# Patient Record
Sex: Female | Born: 1995 | Race: Black or African American | Hispanic: No | Marital: Single | Smoking: Never smoker
Health system: Southern US, Community
[De-identification: ages and names within clinical notes are randomized; demographics above are authoritative.]

## PROBLEM LIST (undated history)

## (undated) HISTORY — PX: MANDIBLE SURGERY: SHX707

---

## 2015-12-07 DIAGNOSIS — Z9109 Other allergy status, other than to drugs and biological substances: Secondary | ICD-10-CM | POA: Insufficient documentation

## 2016-07-24 DIAGNOSIS — G43109 Migraine with aura, not intractable, without status migrainosus: Secondary | ICD-10-CM | POA: Insufficient documentation

## 2018-04-16 ENCOUNTER — Encounter (HOSPITAL_COMMUNITY): Payer: Self-pay | Admitting: Emergency Medicine

## 2018-04-16 ENCOUNTER — Emergency Department (HOSPITAL_COMMUNITY): Payer: BLUE CROSS/BLUE SHIELD

## 2018-04-16 ENCOUNTER — Emergency Department (HOSPITAL_COMMUNITY)
Admission: EM | Admit: 2018-04-16 | Discharge: 2018-04-16 | Disposition: A | Discharge status: Home / Self Care | Payer: BLUE CROSS/BLUE SHIELD | Attending: Emergency Medicine | Admitting: Emergency Medicine

## 2018-04-16 DIAGNOSIS — J069 Acute upper respiratory infection, unspecified: Secondary | ICD-10-CM | POA: Diagnosis not present

## 2018-04-16 DIAGNOSIS — R002 Palpitations: Secondary | ICD-10-CM | POA: Insufficient documentation

## 2018-04-16 DIAGNOSIS — Z79899 Other long term (current) drug therapy: Secondary | ICD-10-CM | POA: Diagnosis not present

## 2018-04-16 LAB — BASIC METABOLIC PANEL
Anion gap: 10 (ref 5–15)
BUN: 7 mg/dL (ref 6–20)
CO2: 27 mmol/L (ref 22–32)
Calcium: 9.4 mg/dL (ref 8.9–10.3)
Chloride: 102 mmol/L (ref 98–111)
Creatinine, Ser: 0.86 mg/dL (ref 0.44–1.00)
GFR calc Af Amer: >60 mL/min (ref >60)
GFR calc non Af Amer: >60 mL/min (ref >60)
GLUCOSE: 107 mg/dL — AB (ref 70–99)
Potassium: 3.3 mmol/L — ABNORMAL LOW (ref 3.5–5.1)
Sodium: 139 mmol/L (ref 135–145)

## 2018-04-16 LAB — CBC WITH DIFFERENTIAL/PLATELET
Abs Immature Granulocytes: 0.04 10*3/uL (ref 0.00–0.07)
Basophils Absolute: 0.1 10*3/uL (ref 0.0–0.1)
Basophils Relative: 1 %
Eosinophils Absolute: 0 10*3/uL (ref 0.0–0.5)
Eosinophils Relative: 1 %
HEMATOCRIT: 39.4 % (ref 36.0–46.0)
HEMOGLOBIN: 12.4 g/dL (ref 12.0–15.0)
Immature Granulocytes: 1 %
Lymphocytes Relative: 28 %
Lymphs Abs: 1.5 10*3/uL (ref 0.7–4.0)
MCH: 28.9 pg (ref 26.0–34.0)
MCHC: 31.5 g/dL (ref 30.0–36.0)
MCV: 91.8 fL (ref 80.0–100.0)
MONOS PCT: 10 %
Monocytes Absolute: 0.5 10*3/uL (ref 0.1–1.0)
Neutro Abs: 3.1 10*3/uL (ref 1.7–7.7)
Neutrophils Relative %: 59 %
Platelets: 330 10*3/uL (ref 150–400)
RBC: 4.29 MIL/uL (ref 3.87–5.11)
RDW: 13.3 % (ref 11.5–15.5)
WBC: 5.2 10*3/uL (ref 4.0–10.5)
nRBC: 0 % (ref 0.0–0.2)

## 2018-04-16 LAB — I-STAT TROPONIN, ED: Troponin i, poc: 0 ng/mL (ref 0.00–0.08)

## 2018-04-16 MED ORDER — BENZONATATE 200 MG PO CAPS: 200.0000 mg | ORAL_CAPSULE | Freq: Three times a day (TID) | ORAL | 0 refills | Status: DC | PRN

## 2018-04-16 MED ORDER — GUAIFENESIN ER 600 MG PO TB12: 1200.0000 mg | ORAL_TABLET | Freq: Two times a day (BID) | ORAL | 0 refills | Status: DC

## 2018-04-16 NOTE — ED Triage Notes (Addendum)
Patient reports intermittent palpitations with mild SOB this evening , denies chest pain , no cough or congestion . Patient stated taking OTC medication for sore throat and left ear ache .

## 2018-04-16 NOTE — ED Provider Notes (Signed)
[facility] EMERGENCY DEPARTMENT Provider Note   CSN: [id number] Arrival date & time: 04/16/18  0207     History   Chief Complaint Chief Complaint  Patient presents with  . Palpitations    HPI Katherine Dunlap is a 22 y.o. female.  Patient presents to the emergency department for evaluation of intermittent episodes of palpitations.  Patient reports symptoms began this evening.  She noticed that her heart seemed to be irregular and possibly racing, felt slightly short of breath.  She did not have any chest pain.  Patient reports that she has been sick for the last 4 days.  She saw her primary doctor for this, developed sore throat, left ear pain, congestion.  She has been taking over-the-counter medications.  She reports that she took an over-the-counter cold pill tonight, not very long after she took a liquid cold medicine.  The palpitations started after the second dose of meds.     History reviewed. No pertinent past medical history.  There are no active problems to display for this patient.   Past Surgical History:  Procedure Laterality Date  . MANDIBLE SURGERY       OB History   No obstetric history on file.      Home Medications    Prior to Admission medications   Medication Sig Start Date End Date Taking? Authorizing Provider  dextromethorphan (DELSYM) 30 MG/5ML liquid Take 60 mg by mouth as needed for cough.   Yes [provider]  Melatonin 5 MG TABS Take 5 mg by mouth at bedtime.   Yes [provider]  valACYclovir (VALTREX) 500 MG tablet Take 500 mg by mouth daily. 04/12/18  Yes [provider]  benzonatate (TESSALON) 200 MG capsule Take 1 capsule (200 mg total) by mouth 3 (three) times daily as needed for cough. 04/16/18   Gilda Crease, MD  guaiFENesin (MUCINEX) 600 MG 12 hr tablet Take 2 tablets (1,200 mg total) by mouth 2 (two) times daily. 04/16/18   Gilda Crease, MD    Family History No family  history on file.  Social History Social History   Tobacco Use  . Smoking status: Never Smoker  . Smokeless tobacco: Never Used  Substance Use Topics  . Alcohol use: Never    Frequency: Never  . Drug use: Never     Allergies   Patient has no known allergies.   Review of Systems Review of Systems  Respiratory: Positive for shortness of breath.   Cardiovascular: Positive for palpitations.  All other systems reviewed and are negative.    Physical Exam Updated Vital Signs Ht 5\' 6"  (1.676 m)   Wt 111.6 kg   LMP 04/01/2018   BMI 39.71 kg/m   Physical Exam Vitals signs and nursing note reviewed.  Constitutional:      General: She is not in acute distress.    Appearance: Normal appearance. She is well-developed.  HENT:     Head: Normocephalic and atraumatic.     Right Ear: Hearing normal.     Left Ear: Hearing and tympanic membrane normal.     Nose: Nose normal.     Mouth/Throat:     Pharynx: No oropharyngeal exudate or posterior oropharyngeal erythema.  Eyes:     Conjunctiva/sclera: Conjunctivae normal.     Pupils: Pupils are equal, round, and reactive to light.  Neck:     Musculoskeletal: Normal range of motion and neck supple.  Cardiovascular:     Rate and Rhythm: Regular  rhythm.     Heart sounds: S1 normal and S2 normal. No murmur. No friction rub. No gallop.   Pulmonary:     Effort: Pulmonary effort is normal. No respiratory distress.     Breath sounds: Normal breath sounds.  Chest:     Chest wall: No tenderness.  Abdominal:     General: Bowel sounds are normal.     Palpations: Abdomen is soft.     Tenderness: There is no abdominal tenderness. There is no guarding or rebound. Negative signs include Murphy's sign and McBurney's sign.     Hernia: No hernia is present.  Musculoskeletal: Normal range of motion.  Skin:    General: Skin is warm and dry.     Findings: No rash.  Neurological:     Mental Status: She is alert and oriented to person, place, and  time.     GCS: GCS eye subscore is 4. GCS verbal subscore is 5. GCS motor subscore is 6.     Cranial Nerves: No cranial nerve deficit.     Sensory: No sensory deficit.     Coordination: Coordination normal.  Psychiatric:        Speech: Speech normal.        Behavior: Behavior normal.        Thought Content: Thought content normal.      ED Treatments / Results  Labs (all labs ordered are listed, but only abnormal results are displayed) Labs Reviewed  BASIC METABOLIC PANEL - Abnormal; Notable for the following components:      Result Value   Potassium 3.3 (*)    Glucose, Bld 107 (*)    All other components within normal limits  CBC WITH DIFFERENTIAL/PLATELET  I-STAT TROPONIN, ED    EKG EKG Interpretation  Date/Time:  Friday April 16 2018 02:17:31 EST Ventricular Rate:  67 PR Interval:    QRS Duration: 90 QT Interval:  394 QTC Calculation: 416 R Axis:   30 Text Interpretation:  Sinus arrhythmia Otherwise within normal limits Confirmed by Gilda CreasePollina, Lochlin Eppinger J [phone]([id number]) on 04/16/2018 2:32:09 AM   Radiology Dg Chest 2 View  Result Date: 04/16/2018 CLINICAL DATA:  22 y/o F; palpitations, shortness of breath, and sore throat for 1 week. EXAM: CHEST - 2 VIEW COMPARISON:  None. FINDINGS: The heart size and mediastinal contours are within normal limits. Both lungs are clear. The visualized skeletal structures are unremarkable. IMPRESSION: No acute pulmonary process identified. Electronically Signed   By: Mitzi HansenLance  Furusawa-Stratton M.D.   On: 04/16/2018 02:59    Procedures Procedures (including critical care time)  Medications Ordered in ED Medications - No data to display   Initial Impression / Assessment and Plan / ED Course  I have reviewed the triage vital signs and the nursing notes.  Pertinent labs & imaging results that were available during my care of the patient were reviewed by me and considered in my medical decision making (see chart for details).      Patient presents to the emergency department for evaluation of palpitations.  Patient reports that she has been sick for several days with a cold.  She has been taking multiple over-the-counter cold medications.  She took 2 different medicines in a short period of time tonight and then developed palpitations.  Suspect that the palpitations are secondary to the OTC medication.  She has not had any significant arrhythmia noted on the monitor today.  EKG was unremarkable.  Lab work is unremarkable.  Chest x-ray does not show any  evidence of pneumonia or other infectious etiology.  Examination is otherwise unremarkable.  Patient does not require any further work-up.  Will prescribe URI medication regimen that will not cause symptoms, discontinue OTC cold medications.  Final Clinical Impressions(s) / ED Diagnoses   Final diagnoses:  Palpitations  Viral upper respiratory tract infection    ED Discharge Orders         Ordered    benzonatate (TESSALON) 200 MG capsule  3 times daily PRN     04/16/18 0405    guaiFENesin (MUCINEX) 600 MG 12 hr tablet  2 times daily     04/16/18 0405           Gilda Crease, MD 04/16/18 [phone]

## 2018-04-16 NOTE — ED Notes (Signed)
Patient transported to X-ray 

## 2018-04-17 ENCOUNTER — Emergency Department (HOSPITAL_COMMUNITY)
Admission: EM | Admit: 2018-04-17 | Discharge: 2018-04-17 | Disposition: A | Discharge status: Home / Self Care | Payer: BLUE CROSS/BLUE SHIELD | Attending: Emergency Medicine | Admitting: Emergency Medicine

## 2018-04-17 DIAGNOSIS — N760 Acute vaginitis: Secondary | ICD-10-CM | POA: Insufficient documentation

## 2018-04-17 DIAGNOSIS — B9689 Other specified bacterial agents as the cause of diseases classified elsewhere: Secondary | ICD-10-CM

## 2018-04-17 DIAGNOSIS — Z79899 Other long term (current) drug therapy: Secondary | ICD-10-CM | POA: Diagnosis not present

## 2018-04-17 DIAGNOSIS — N898 Other specified noninflammatory disorders of vagina: Secondary | ICD-10-CM

## 2018-04-17 LAB — URINALYSIS, ROUTINE W REFLEX MICROSCOPIC
Bilirubin Urine: NEGATIVE
Glucose, UA: NEGATIVE mg/dL
Hgb urine dipstick: NEGATIVE
Ketones, ur: NEGATIVE mg/dL
Leukocytes, UA: NEGATIVE
Nitrite: NEGATIVE
Protein, ur: NEGATIVE mg/dL
Specific Gravity, Urine: 1.012 (ref 1.005–1.030)
pH: 6 (ref 5.0–8.0)

## 2018-04-17 LAB — WET PREP, GENITAL
Sperm: NONE SEEN
TRICH WET PREP: NONE SEEN
WBC WET PREP: NONE SEEN
Yeast Wet Prep HPF POC: NONE SEEN

## 2018-04-17 LAB — I-STAT BETA HCG BLOOD, ED (MC, WL, AP ONLY): I-stat hCG, quantitative: <5 m[IU]/mL (ref <5)

## 2018-04-17 MED ORDER — METRONIDAZOLE 500 MG PO TABS: 500.0000 mg | ORAL_TABLET | Freq: Two times a day (BID) | ORAL | 0 refills | Status: DC

## 2018-04-17 NOTE — ED Triage Notes (Signed)
Pt reports vaginal discharge since last Saturday, reports recent herpes tests have been negative, reports what feels like a sore inside her vagina and difficulty urinating. Also reports lip tingling.

## 2018-04-17 NOTE — ED Provider Notes (Signed)
[facility] Provider Note   CSN: [id number] Arrival date & time: 04/17/18  1230     History   Chief Complaint Chief Complaint  Patient presents with  . Vaginal Discharge    HPI Katherine Dunlap is a 22 y.o. female.  Katherine Dunlap is a 22 y.o. female who is otherwise healthy, presents to the emergency department for evaluation of vaginal irritation and discharge since last Saturday.  She was seen by her primary care doctor with concern for a bump in her perineal region, had reassuring exam at that time and negative herpes testing but she reports this morning she started having worsening discharge and pain at the opening of the vagina.  She denies any dysuria or urinary frequency.  Has not had any vaginal bleeding.  No abdominal pain, nausea, vomiting or fevers.  Patient appears fixated on patient repeatedly asking about herpes even though she has had both negative serum and culture testing from PCP twice in the last 2 months, and wondering if it looks like she has any lesions over her lips that could be herpes as well.  She reports her sister has herpes and she is very concerned that she could have this after seeing what her sister goes through with it.     No past medical history on file.  There are no active problems to display for this patient.   Past Surgical History:  Procedure Laterality Date  . MANDIBLE SURGERY       OB History   No obstetric history on file.      Home Medications    Prior to Admission medications   Medication Sig Start Date End Date Taking? Authorizing Provider  benzonatate (TESSALON) 200 MG capsule Take 1 capsule (200 mg total) by mouth 3 (three) times daily as needed for cough. 04/16/18   Gilda CreasePollina, Christopher J, MD  dextromethorphan (DELSYM) 30 MG/5ML liquid Take 60 mg by mouth as needed for cough.    [provider]  guaiFENesin (MUCINEX) 600 MG 12 hr tablet Take 2 tablets (1,200 mg total) by mouth 2 (two)  times daily. 04/16/18   Gilda CreasePollina, Christopher J, MD  Melatonin 5 MG TABS Take 5 mg by mouth at bedtime.    [provider]  metroNIDAZOLE (FLAGYL) 500 MG tablet Take 1 tablet (500 mg total) by mouth 2 (two) times daily. One po bid x 7 days 04/17/18   Dartha LodgeFord, Taleya Whitcher N, PA-C  valACYclovir (VALTREX) 500 MG tablet Take 500 mg by mouth daily. 04/12/18   [provider]    Family History No family history on file.  Social History Social History   Tobacco Use  . Smoking status: Never Smoker  . Smokeless tobacco: Never Used  Substance Use Topics  . Alcohol use: Never    Frequency: Never  . Drug use: Never     Allergies   Patient has no known allergies.   Review of Systems Review of Systems  Constitutional: Negative for chills and fever.  HENT: Negative for mouth sores.   Gastrointestinal: Negative for abdominal pain, nausea and vomiting.  Genitourinary: Positive for vaginal discharge and vaginal pain. Negative for dysuria, flank pain, frequency, genital sores, menstrual problem, pelvic pain and vaginal bleeding.  Skin: Negative for color change and rash.  All other systems reviewed and are negative.    Physical Exam Updated Vital Signs BP (!) 152/97   Pulse 93   Temp 98.3 F (36.8 C) (Oral)   Resp 16  Ht 5\' 6"  (1.676 m)   Wt 111.6 kg   LMP 04/01/2018   SpO2 96%   BMI 39.70 kg/m   Physical Exam Vitals signs and nursing note reviewed. Exam conducted with a chaperone present.  Constitutional:      General: She is not in acute distress.    Appearance: She is well-developed. She is not diaphoretic.  HENT:     Head: Normocephalic and atraumatic.     Mouth/Throat:     Pharynx: Oropharynx is clear.     Comments: No lesions noted over the lips or buccal mucosa Eyes:     General:        Right eye: No discharge.        Left eye: No discharge.  Pulmonary:     Effort: Pulmonary effort is normal. No respiratory distress.  Abdominal:     General: Abdomen is  flat. Bowel sounds are normal. There is no distension.     Palpations: Abdomen is soft. There is no mass.     Tenderness: There is no abdominal tenderness. There is no guarding.     Comments: Abdomen soft, nondistended, nontender to palpation in all quadrants without guarding or peritoneal signs  Genitourinary:    Comments: No external genital lesions noted on exam. Speculum exam reveals mildly erythematous vaginal walls, no evidence of Bartholin cyst.  Moderate amount of white foul-smelling discharge present in the vaginal vault.  Bimanual exam reveals no cervical motion tenderness, no focal adnexal tenderness or masses bilaterally. Neurological:     Mental Status: She is alert.     Coordination: Coordination normal.  Psychiatric:        Behavior: Behavior normal.      ED Treatments / Results  Labs (all labs ordered are listed, but only abnormal results are displayed) Labs Reviewed  WET PREP, GENITAL - Abnormal; Notable for the following components:      Result Value   Clue Cells Wet Prep HPF POC PRESENT (*)    All other components within normal limits  URINALYSIS, ROUTINE W REFLEX MICROSCOPIC  RPR  HIV ANTIBODY (ROUTINE TESTING W REFLEX)  I-STAT BETA HCG BLOOD, ED ([facility], [facility], [facility] ONLY)  GC/CHLAMYDIA PROBE AMP ([place]) NOT AT [facility]    EKG None  Radiology Dg Chest 2 View  Result Date: 04/16/2018 CLINICAL DATA:  22 y/o F; palpitations, shortness of breath, and sore throat for 1 week. EXAM: CHEST - 2 VIEW COMPARISON:  None. FINDINGS: The heart size and mediastinal contours are within normal limits. Both lungs are clear. The visualized skeletal structures are unremarkable. IMPRESSION: No acute pulmonary process identified. Electronically Signed   By: Mitzi Hansen M.D.   On: 04/16/2018 02:59    Procedures Procedures (including critical care time)  Medications Ordered in ED Medications - No data to display   Initial Impression / Assessment and Plan / ED Course    I have reviewed the triage vital signs and the nursing notes.  Pertinent labs & imaging results that were available during my care of the patient were reviewed by me and considered in my medical decision making (see chart for details).  Patient presents with concern for vaginal irritation and discharge.  She had a bump in the perineal region earlier this week and saw her PCP for it and had negative herpes testing but reports she is since developed some irritation at the vaginal opening and vaginal discharge.  No abdominal pain, nausea or vomiting, no fevers.  On exam patient has  moderate amounts of foul-smelling thin white discharge, no genital lesions noted.  No evidence of Bartholin's gland cyst.  Wet prep consistent with BV, no WBCs or yeast.  Bimanual exam not concerning for PID.  STD testing pending and patient is aware that if this returns positive she will need to follow-up with her PCP for treatment and notify any partners, but will treat with Flagyl for BV in the meantime and have her follow-up if discharge and vaginal irritation does not resolve.  Patient expresses understanding and agreement with plan.  Stable for discharge home in good condition.  Final Clinical Impressions(s) / ED Diagnoses   Final diagnoses:  BV (bacterial vaginosis)  Vaginal discharge    ED Discharge Orders         Ordered    metroNIDAZOLE (FLAGYL) 500 MG tablet  2 times daily     04/17/18 1405           Dartha Lodge, [place] 04/17/18 1430    Maia Plan, MD 04/17/18 2025

## 2018-04-17 NOTE — ED Notes (Signed)
Pt stable, ambulatory, states understanding of discharge instructions 

## 2018-04-17 NOTE — Discharge Instructions (Signed)
Please take antibiotics as directed to treat bacterial vaginosis, you have STD testing pending will be contacted in 2 to 3 days if any results are positive.  If your vaginal irritation and discharge does not resolve with treatment of BV please follow-up with your primary care doctor for reevaluation.  Return to the emergency department if you develop abdominal pain, fevers, vomiting or any other new or concerning symptoms.

## 2018-04-18 LAB — HIV ANTIBODY (ROUTINE TESTING W REFLEX): HIV Screen 4th Generation wRfx: NONREACTIVE

## 2018-04-18 LAB — RPR: RPR Ser Ql: NONREACTIVE

## 2018-04-19 LAB — GC/CHLAMYDIA PROBE AMP (~~LOC~~) NOT AT ARMC
Chlamydia: NEGATIVE
NEISSERIA GONORRHEA: NEGATIVE

## 2018-05-11 DIAGNOSIS — F322 Major depressive disorder, single episode, severe without psychotic features: Secondary | ICD-10-CM | POA: Insufficient documentation

## 2019-01-25 ENCOUNTER — Encounter (HOSPITAL_COMMUNITY): Payer: Self-pay | Admitting: Emergency Medicine

## 2019-01-25 ENCOUNTER — Emergency Department (HOSPITAL_COMMUNITY)
Admission: EM | Admit: 2019-01-25 | Discharge: 2019-01-25 | Disposition: A | Discharge status: Home / Self Care | Payer: BC Managed Care – PPO | Attending: Emergency Medicine | Admitting: Emergency Medicine

## 2019-01-25 ENCOUNTER — Other Ambulatory Visit: Payer: Self-pay

## 2019-01-25 ENCOUNTER — Emergency Department (HOSPITAL_COMMUNITY): Payer: BC Managed Care – PPO

## 2019-01-25 DIAGNOSIS — R05 Cough: Secondary | ICD-10-CM | POA: Insufficient documentation

## 2019-01-25 DIAGNOSIS — R072 Precordial pain: Secondary | ICD-10-CM | POA: Insufficient documentation

## 2019-01-25 DIAGNOSIS — Z20828 Contact with and (suspected) exposure to other viral communicable diseases: Secondary | ICD-10-CM | POA: Insufficient documentation

## 2019-01-25 DIAGNOSIS — Z79899 Other long term (current) drug therapy: Secondary | ICD-10-CM | POA: Insufficient documentation

## 2019-01-25 DIAGNOSIS — R0602 Shortness of breath: Secondary | ICD-10-CM | POA: Diagnosis not present

## 2019-01-25 DIAGNOSIS — R059 Cough, unspecified: Secondary | ICD-10-CM

## 2019-01-25 DIAGNOSIS — R079 Chest pain, unspecified: Secondary | ICD-10-CM | POA: Diagnosis present

## 2019-01-25 LAB — BASIC METABOLIC PANEL
Anion gap: 10 (ref 5–15)
BUN: 8 mg/dL (ref 6–20)
CO2: 24 mmol/L (ref 22–32)
Calcium: 9.4 mg/dL (ref 8.9–10.3)
Chloride: 105 mmol/L (ref 98–111)
Creatinine, Ser: 0.75 mg/dL (ref 0.44–1.00)
GFR calc Af Amer: >60 mL/min (ref >60)
GFR calc non Af Amer: >60 mL/min (ref >60)
Glucose, Bld: 99 mg/dL (ref 70–99)
Potassium: 3.5 mmol/L (ref 3.5–5.1)
Sodium: 139 mmol/L (ref 135–145)

## 2019-01-25 LAB — CBC
HCT: 39.3 % (ref 36.0–46.0)
Hemoglobin: 12.5 g/dL (ref 12.0–15.0)
MCH: 29.4 pg (ref 26.0–34.0)
MCHC: 31.8 g/dL (ref 30.0–36.0)
MCV: 92.5 fL (ref 80.0–100.0)
Platelets: 332 10*3/uL (ref 150–400)
RBC: 4.25 MIL/uL (ref 3.87–5.11)
RDW: 13.4 % (ref 11.5–15.5)
WBC: 3.3 10*3/uL — ABNORMAL LOW (ref 4.0–10.5)
nRBC: 0 % (ref 0.0–0.2)

## 2019-01-25 LAB — LIPASE, BLOOD: Lipase: 20 U/L (ref 11–51)

## 2019-01-25 LAB — HEPATIC FUNCTION PANEL
ALT: 13 U/L (ref 0–44)
AST: 16 U/L (ref 15–41)
Albumin: 4.2 g/dL (ref 3.5–5.0)
Alkaline Phosphatase: 53 U/L (ref 38–126)
Bilirubin, Direct: <0.1 mg/dL (ref 0.0–0.2)
Total Bilirubin: 0.4 mg/dL (ref 0.3–1.2)
Total Protein: 7.5 g/dL (ref 6.5–8.1)

## 2019-01-25 LAB — I-STAT BETA HCG BLOOD, ED (MC, WL, AP ONLY): I-stat hCG, quantitative: <5 m[IU]/mL (ref <5)

## 2019-01-25 LAB — TROPONIN I (HIGH SENSITIVITY)
Troponin I (High Sensitivity): 3 ng/L (ref <18)
Troponin I (High Sensitivity): 3 ng/L (ref <18)

## 2019-01-25 MED ORDER — NAPROXEN 500 MG PO TABS: 500.0000 mg | ORAL_TABLET | Freq: Two times a day (BID) | ORAL | 0 refills | Status: DC

## 2019-01-25 MED ORDER — OMEPRAZOLE 20 MG PO CPDR: 20.0000 mg | DELAYED_RELEASE_CAPSULE | Freq: Every day | ORAL | 0 refills | Status: DC

## 2019-01-25 MED ORDER — KETOROLAC TROMETHAMINE 30 MG/ML IJ SOLN
30.0000 mg | Freq: Once | INTRAMUSCULAR | Status: AC
  Administered 2019-01-25: 19:00:00 30 mg via INTRAMUSCULAR

## 2019-01-25 MED ORDER — LIDOCAINE VISCOUS HCL 2 % MT SOLN
15.0000 mL | Freq: Once | OROMUCOSAL | Status: AC
  Administered 2019-01-25: 15 mL via ORAL
  Filled 2019-01-25: qty 15

## 2019-01-25 MED ORDER — SODIUM CHLORIDE 0.9% FLUSH: 3.0000 mL | Freq: Once | INTRAVENOUS | Status: DC

## 2019-01-25 MED ORDER — KETOROLAC TROMETHAMINE 30 MG/ML IJ SOLN
30.0000 mg | Freq: Once | INTRAMUSCULAR | Status: DC
  Filled 2019-01-25: qty 1

## 2019-01-25 MED ORDER — ALUM & MAG HYDROXIDE-SIMETH 200-200-20 MG/5ML PO SUSP
30.0000 mL | Freq: Once | ORAL | Status: AC
  Administered 2019-01-25: 30 mL via ORAL
  Filled 2019-01-25: qty 30

## 2019-01-25 NOTE — ED Provider Notes (Signed)
[facility] DEPARTMENT Provider Note   CSN: [id number] Arrival date & time: 01/25/19  1116   History   Chief Complaint Chief Complaint  Patient presents with  . Chest Pain  . Shortness of Breath   HPI Katherine Dunlap is a 23 y.o. female with no significant past medical history who presents for evaluation of chest pain.  Patient states she had chest pain that awoke her from sleep yesterday morning.  Was seen at outside hospital and told this is likely chest wall pain.  Patient states she came in today due to chest pain not relieved with Advil as well as intermittent shortness of breath.  Chest pain nonexertional, nonpleuritic in nature.  No hemoptysis.  She is also had some epigastric pain and "reflux" type symptoms.  Describes her pain as a burning sensation.  Pain does not radiate.  Does have prior history of gallstones however states this does not feel similar.  Has been taking Advil at home without relief of her symptoms.  No recent COVID exposures however she has had a nonproductive cough over the last week.  Denies fever, chills, nausea, vomiting, diarrhea, pelvic pain unilateral weakness, facial droop, paresthesias.  No history of connective tissue disorders, family history of early MI, aneurysm, dissection.  She denies history of hypertension, hypercholesterolemia, diabetes.  No prior cardiac or lung disorders.  Rates her current pain a 3/10.  Describes it as a burning sensation.  Denies additional aggravating or alleviating factors. No history of PE, DVT.  No recent surgery, unilateral leg swelling, redness or warmth. No OCP use.  History obtained from patient and past medical records.  No interpreter is used.     HPI  History reviewed. No pertinent past medical history.  There are no active problems to display for this patient.   Past Surgical History:  Procedure Laterality Date  . MANDIBLE SURGERY       OB History   No obstetric history on file.     Home Medications    Prior to Admission medications   Medication Sig Start Date End Date Taking? Authorizing Provider  diphenhydrAMINE (BENADRYL) 25 mg capsule Take 25 mg by mouth every 6 (six) hours as needed for allergies.   Yes [provider]  gabapentin (NEURONTIN) 300 MG capsule Take 300 mg by mouth 3 (three) times daily. 01/18/19  Yes [provider]  naproxen sodium (ALEVE) 220 MG tablet Take 440 mg by mouth 2 (two) times daily as needed (pain).   Yes [provider]  naproxen (NAPROSYN) 500 MG tablet Take 1 tablet (500 mg total) by mouth 2 (two) times daily. 01/25/19   Dimitry Holsworth A, PA-C  omeprazole (PRILOSEC) 20 MG capsule Take 1 capsule (20 mg total) by mouth daily. 01/25/19   Careena Degraffenreid A, PA-C   Family History No family history on file.  Social History Social History   Tobacco Use  . Smoking status: Never Smoker  . Smokeless tobacco: Never Used  Substance Use Topics  . Alcohol use: Yes    Frequency: Never  . Drug use: Never     Allergies   Penicillins   Review of Systems Review of Systems  Constitutional: Negative.   HENT: Negative.   Respiratory: Positive for cough and shortness of breath. Negative for apnea, choking, chest tightness, wheezing and stridor.   Cardiovascular: Positive for chest pain. Negative for palpitations and leg swelling.  Gastrointestinal: Negative.  Negative for abdominal distention, abdominal pain, anal bleeding, blood in stool,  constipation, diarrhea, nausea, rectal pain and vomiting.       Acid reflux  Genitourinary: Negative.   Musculoskeletal: Negative.   Skin: Negative.   Neurological: Negative.   All other systems reviewed and are negative.    Physical Exam Updated Vital Signs BP (!) 165/79 (BP Location: Right Arm)   Pulse (!) 46   Temp 98.4 F (36.9 C) (Oral)   Resp 17   LMP 01/22/2019 (Exact Date)   SpO2 100%   Physical Exam Vitals signs and nursing note reviewed.   Constitutional:      General: She is not in acute distress.    Appearance: She is well-developed. She is not ill-appearing, toxic-appearing or diaphoretic.  HENT:     Head: Normocephalic and atraumatic.     Jaw: There is normal jaw occlusion.     Right Ear: Tympanic membrane is not erythematous or retracted.     Mouth/Throat:     Comments: Posterior oropharynx clear.  Mucous membranes moist. Phonation normal. Eyes:     Extraocular Movements: Extraocular movements intact.     Pupils: Pupils are equal, round, and reactive to light.  Neck:     Musculoskeletal: Normal range of motion.     Vascular: No carotid bruit or JVD.     Trachea: Trachea and phonation normal.     Comments: No Neck stiffness or neck rigidity.   No cervical lymphadenopathy. Cardiovascular:     Rate and Rhythm: Normal rate and regular rhythm.     Pulses:          Radial pulses are 2+ on the right side and 2+ on the left side.     Heart sounds: Normal heart sounds.     Comments: No murmurs rubs or gallops. Pulmonary:     Effort: Pulmonary effort is normal. No respiratory distress.     Comments: Clear to auscultation bilateral without wheeze, rhonchi or rales.  No accessory muscle usage. Chest:     Comments: Chest wall tenderness to palpation to sub sternal area without crepitus, step offs. Abdominal:     General: There is no distension.     Comments: Mild epigastric tenderness palpation.  Negative Murphy sign.  No rebound or guarding.  Musculoskeletal: Normal range of motion.     Comments: Moves all 4 extremities without difficulty.  Lower extremities without edema, erythema or warmth.  Lymphadenopathy:     Cervical: No cervical adenopathy.  Skin:    General: Skin is warm and dry.     Comments: Brisk capillary refill.  No rashes or lesions.  Neurological:     Mental Status: She is alert.     Comments: Ambulatory in department without difficulty.  Cranial nerves II through XII grossly intact.  No facial droop.   No aphasia.    ED Treatments / Results  Labs (all labs ordered are listed, but only abnormal results are displayed) Labs Reviewed  CBC - Abnormal; Notable for the following components:      Result Value   WBC 3.3 (*)    All other components within normal limits  NOVEL CORONAVIRUS, NAA (HOSP ORDER, SEND-OUT TO REF LAB; TAT 18-24 HRS)  BASIC METABOLIC PANEL  LIPASE, BLOOD  HEPATIC FUNCTION PANEL  I-STAT BETA HCG BLOOD, ED ([facility], [facility], [facility] ONLY)  TROPONIN I (HIGH SENSITIVITY)  TROPONIN I (HIGH SENSITIVITY)    EKG EKG Interpretation  Date/Time:  Tuesday January 25 2019 11:39:49 EDT Ventricular Rate:  65 PR Interval:  184 QRS Duration: 84 QT Interval:  402 QTC Calculation: 418 R Axis:   75 Text Interpretation:  Normal sinus rhythm Normal ECG Confirmed by Benjiman Core [phone]) on 01/25/2019 6:44:46 PM   Radiology Dg Chest 2 View  Result Date: 01/25/2019 CLINICAL DATA:  Chest pain. EXAM: CHEST - 2 VIEW COMPARISON:  04/16/2018. FINDINGS: Mediastinum hilar structures normal. Lungs are clear. No pleural effusion or pneumothorax. Heart size normal. No acute bony abnormality. IMPRESSION: No acute cardiopulmonary disease. Electronically Signed   By: Maisie Fus  Register   On: 01/25/2019 12:12    Procedures Procedures (including critical care time)  Medications Ordered in ED Medications  sodium chloride flush (NS) 0.9 % injection 3 mL (has no administration in time range)  alum & mag hydroxide-simeth (MAALOX/MYLANTA) 200-200-20 MG/5ML suspension 30 mL (30 mLs Oral Given 01/25/19 1829)    And  lidocaine (XYLOCAINE) 2 % viscous mouth solution 15 mL (15 mLs Oral Given 01/25/19 1829)  ketorolac (TORADOL) 30 MG/ML injection 30 mg (30 mg Intramuscular Given 01/25/19 1838)    Initial Impression / Assessment and Plan / ED Course  I have reviewed the triage vital signs and the nursing notes.  Pertinent labs & imaging results that were available during my care of the patient were reviewed by  me and considered in my medical decision making (see chart for details).  23 year old female appears otherwise well presents for evaluation of chest pain.  Chest pain nonexertional nonpleuritic in nature.  No evidence of DVT on exam.  Heart score 0.  She is PERC negative, Wells criteria low risk.  Delta troponin negative.  EKG without ST/T changes.  Chest pain reproducible to palpation to chest wall.  She does have minimal epigastric tenderness however she has negative Murphy sign.  I have low suspicion for cholelithiasis, cholecystitis, choledocholithiasis.  He has had a cough however is without hemoptysis or melena or hematochezia.  No known code exposures.  Heart and lungs clear.  Chest X-ray without cardiomegaly, pulmonary edema, pneumothorax, infiltrates.  CBC without leukocytosis metabolic panel without electrolyte, renal or liver abnormality.  Elevation in LFTs.  Lipase 20.  Pregnancy test negative.  Pain reproducible to palpation likely chest wall pain. CP resolved with Toradol and GI cocktail. VS stable.  Low suspicion for ACS, PE, dissection, [place], myocarditis, pericarditis, infectious process.  Patient is to be discharged with recommendation to follow up with PCP in regards to today's hospital visit. Chest pain is not likely of cardiac or pulmonary etiology d/t presentation, PERC negative, VSS, no tracheal deviation, no JVD or new murmur, RRR, breath sounds equal bilaterally, EKG without acute abnormalities, negative troponin, and negative CXR. Pt has been advised to return to the ED if CP becomes exertional, associated with diaphoresis or nausea, radiates to left jaw/arm, worsens or becomes concerning in any way. Pt appears reliable for follow up and is agreeable to discharge.       Mea Crocker was evaluated in Emergency Department on 01/25/2019 for the symptoms described in the history of present illness. She was evaluated in the context of the global COVID-19 pandemic, which necessitated  consideration that the patient might be at risk for infection with the SARS-CoV-2 virus that causes COVID-19. Institutional protocols and algorithms that pertain to the evaluation of patients at risk for COVID-19 are in a state of rapid change based on information released by regulatory bodies including the CDC and federal and state organizations. These policies and algorithms were followed during the patient's care in the ED. Final Clinical Impressions(s) / ED Diagnoses  Final diagnoses:  Cough  Precordial pain    ED Discharge Orders         Ordered    naproxen (NAPROSYN) 500 MG tablet  2 times daily     01/25/19 1856    omeprazole (PRILOSEC) 20 MG capsule  Daily     01/25/19 1856           Ziyad Dyar A, PA-C 01/25/19 Katherine Sender, MD 01/25/19 [phone]

## 2019-01-25 NOTE — ED Notes (Signed)
Pt st's chest feels better after GI cocktail

## 2019-01-25 NOTE — Discharge Instructions (Signed)
Get help right away if: Your chest pain is worse. You have a cough that gets worse, or you cough up blood. You have very bad (severe) pain in your belly (abdomen). You pass out (faint). You have either of these for no clear reason: Sudden chest discomfort. Sudden discomfort in your arms, back, neck, or jaw. You have shortness of breath at any time. You suddenly start to sweat, or your skin gets clammy. You feel sick to your stomach (nauseous). You throw up (vomit). You suddenly feel lightheaded or dizzy. You feel very weak or tired. Your heart starts to beat fast, or it feels like it is skipping beats. 

## 2019-01-25 NOTE — ED Triage Notes (Signed)
Pt endorses chest pain for since yesterday with shortness of breath and non productive cough located in center of chest without radiation to. Denies  VSS at triage NAD.

## 2019-01-26 LAB — NOVEL CORONAVIRUS, NAA (HOSP ORDER, SEND-OUT TO REF LAB; TAT 18-24 HRS): SARS-CoV-2, NAA: NOT DETECTED

## 2019-02-21 ENCOUNTER — Ambulatory Visit: Payer: Self-pay

## 2019-02-21 NOTE — Telephone Encounter (Signed)
Instructed pt. To go to ED. States she has an appointment Friday, "but I can't wait that long."   Answer Assessment - Initial Assessment Questions 1. LOCATION: "Where does it hurt?"       Chest into back and right arms and legs. 2. RADIATION: "Does the pain go anywhere else?" (e.g., into neck, jaw, arms, back)     Arms, legs, back 3. ONSET: "When did the chest pain begin?" (Minutes, hours or days)      Last month 4. PATTERN "Does the pain come and go, or has it been constant since it started?"  "Does it get worse with exertion?"      Constant 5. DURATION: "How long does it last" (e.g., seconds, minutes, hours)     Hours 6. SEVERITY: "How bad is the pain?"  (e.g., Scale 1-10; mild, moderate, or severe)    - MILD (1-3): doesn't interfere with normal activities     - MODERATE (4-7): interferes with normal activities or awakens from sleep    - SEVERE (8-10): excruciating pain, unable to do any normal activities       Moderate-severe 7. CARDIAC RISK FACTORS: "Do you have any history of heart problems or risk factors for heart disease?" (e.g., angina, prior heart attack; diabetes, high blood pressure, high cholesterol, smoker, or strong family history of heart disease)     no 8. PULMONARY RISK FACTORS: "Do you have any history of lung disease?"  (e.g., blood clots in lung, asthma, emphysema, birth control pills)     no 9. CAUSE: "What do you think is causing the chest pain?"     Unsure 10. OTHER SYMPTOMS: "Do you have any other symptoms?" (e.g., dizziness, nausea, vomiting, sweating, fever, difficulty breathing, cough)       "Feels like my flesh is on fire." 11. PREGNANCY: "Is there any chance you are pregnant?" "When was your last menstrual period?"       No  Protocols used: CHEST PAIN-A-AH

## 2019-06-04 ENCOUNTER — Encounter (HOSPITAL_COMMUNITY): Payer: Self-pay | Admitting: Emergency Medicine

## 2019-06-04 ENCOUNTER — Emergency Department (HOSPITAL_COMMUNITY)
Admission: EM | Admit: 2019-06-04 | Discharge: 2019-06-05 | Disposition: A | Discharge status: Home / Self Care | Payer: BC Managed Care – PPO | Attending: Emergency Medicine | Admitting: Emergency Medicine

## 2019-06-04 ENCOUNTER — Other Ambulatory Visit: Payer: Self-pay

## 2019-06-04 DIAGNOSIS — R519 Headache, unspecified: Secondary | ICD-10-CM | POA: Diagnosis present

## 2019-06-04 DIAGNOSIS — R059 Cough, unspecified: Secondary | ICD-10-CM

## 2019-06-04 DIAGNOSIS — H53149 Visual discomfort, unspecified: Secondary | ICD-10-CM | POA: Insufficient documentation

## 2019-06-04 DIAGNOSIS — R05 Cough: Secondary | ICD-10-CM | POA: Diagnosis not present

## 2019-06-04 DIAGNOSIS — Z79899 Other long term (current) drug therapy: Secondary | ICD-10-CM | POA: Diagnosis not present

## 2019-06-04 DIAGNOSIS — Z8616 Personal history of COVID-19: Secondary | ICD-10-CM | POA: Diagnosis not present

## 2019-06-04 DIAGNOSIS — J019 Acute sinusitis, unspecified: Secondary | ICD-10-CM | POA: Insufficient documentation

## 2019-06-04 NOTE — ED Triage Notes (Signed)
Patient reports left temporal headache with sinus congestion and occasional dry cough onset last week , denies fever or chills .

## 2019-06-05 MED ORDER — PROMETHAZINE-DM 6.25-15 MG/5ML PO SYRP: 5.0000 mL | ORAL_SOLUTION | Freq: Four times a day (QID) | ORAL | 0 refills | Status: DC | PRN

## 2019-06-05 MED ORDER — AZITHROMYCIN 250 MG PO TABS: ORAL_TABLET | ORAL | 0 refills | Status: DC

## 2019-06-05 MED ORDER — DEXAMETHASONE SODIUM PHOSPHATE 10 MG/ML IJ SOLN
10.0000 mg | Freq: Once | INTRAMUSCULAR | Status: AC
  Administered 2019-06-05: 01:00:00 10 mg via INTRAMUSCULAR
  Filled 2019-06-05: qty 1

## 2019-06-05 MED ORDER — DIPHENHYDRAMINE HCL 50 MG/ML IJ SOLN
12.5000 mg | Freq: Once | INTRAMUSCULAR | Status: AC
  Administered 2019-06-05: 01:00:00 12.5 mg via INTRAMUSCULAR
  Filled 2019-06-05: qty 1

## 2019-06-05 MED ORDER — PROCHLORPERAZINE EDISYLATE 10 MG/2ML IJ SOLN
10.0000 mg | Freq: Once | INTRAMUSCULAR | Status: AC
  Administered 2019-06-05: 01:00:00 10 mg via INTRAMUSCULAR
  Filled 2019-06-05: qty 2

## 2019-06-05 NOTE — Discharge Instructions (Addendum)
1. Medications: Azithromycin for sinus infection, Promethazine DM for cough, Tylenol and ibuprofen for headaches, usual home medications 2. Treatment: rest, drink plenty of fluids, 3. Follow Up: Please followup with your primary doctor in 2-3 days for reevaluation.  Please also follow-up with [facility] neurology for further evaluation if your headaches this.; Please return to the ER for worsening headaches, vision changes, high fevers, other concerns.

## 2019-06-05 NOTE — ED Provider Notes (Signed)
[facility] EMERGENCY DEPARTMENT Provider Note   CSN: [id number] Arrival date & time: 06/04/19  2327     History Chief Complaint  Patient presents with  . Headache    Katherine Dunlap is a 24 y.o. female with a hx of major medical problems presents to the Emergency Department complaining of gradual, persistent, progressively worsening generalized headache onset approximately 4 weeks ago but worsening in the last 1 week.  Patient reports she was diagnosed with COVID-19 May 04, 2019.  She reports she had generalized headaches and many other symptoms including cough for a number of weeks.  She reports she is had some persistent nasal congestion but has noticed in the last week that she has had a significant increase in frontal face still pain and headache with increased sinus congestion.  She also reports new postnasal drip and worsening cough.  She denies fevers, chills, neck pain, neck stiffness, abdominal pain, nausea or vomiting or diarrhea since that time.  Patient reports that headache is generalized but worst in the front, throbbing in nature.  She reports some associated photophobia but no blurred vision or diplopia.  No numbness, tingling or weakness.  No gait disturbance.  Patient reports that she had migraine headaches as a teenager and has been previously treated for them but has not been treated in some time.  Patient reports taking cold medication, Tylenol and Advil without significant relief of her headache.  She does also report some pain in her chest when she coughs but no shortness of breath, pain with breathing, dyspnea on exertion or pain at rest.  The history is provided by the patient and medical records. No language interpreter was used.       History reviewed. No pertinent past medical history.  There are no problems to display for this patient.   Past Surgical History:  Procedure Laterality Date  . MANDIBLE SURGERY       OB History   No obstetric  history on file.     No family history on file.  Social History   Tobacco Use  . Smoking status: Never Smoker  . Smokeless tobacco: Never Used  Substance Use Topics  . Alcohol use: Yes  . Drug use: Never    Home Medications Prior to Admission medications   Medication Sig Start Date End Date Taking? Authorizing Provider  azithromycin (ZITHROMAX) 250 MG tablet Take 500 mg (2 tablets) on day 1 and 250 mg (1 tablet) day 2 through 5. 06/05/19   Chrishaun Sasso, Jarrett Soho, PA-C  diphenhydrAMINE (BENADRYL) 25 mg capsule Take 25 mg by mouth every 6 (six) hours as needed for allergies.    [provider]  gabapentin (NEURONTIN) 300 MG capsule Take 300 mg by mouth 3 (three) times daily. 01/18/19   [provider]  naproxen (NAPROSYN) 500 MG tablet Take 1 tablet (500 mg total) by mouth 2 (two) times daily. 01/25/19   Henderly, Britni A, PA-C  naproxen sodium (ALEVE) 220 MG tablet Take 440 mg by mouth 2 (two) times daily as needed (pain).    [provider]  omeprazole (PRILOSEC) 20 MG capsule Take 1 capsule (20 mg total) by mouth daily. 01/25/19   Henderly, Britni A, PA-C  promethazine-dextromethorphan (PROMETHAZINE-DM) 6.25-15 MG/5ML syrup Take 5 mLs by mouth 4 (four) times daily as needed for cough. 06/05/19   Tyton Abdallah, Jarrett Soho, PA-C    Allergies    Penicillins  Review of Systems   Review of Systems  Constitutional: Negative for appetite change, chills,  diaphoresis, fatigue, fever and unexpected weight change.  HENT: Positive for congestion, postnasal drip, sinus pressure and sinus pain. Negative for mouth sores, sore throat and trouble swallowing.   Eyes: Positive for photophobia. Negative for visual disturbance.  Respiratory: Positive for cough. Negative for chest tightness, shortness of breath and wheezing.   Cardiovascular: Positive for chest pain (only with cough).  Gastrointestinal: Negative for abdominal pain, constipation, diarrhea, nausea and vomiting.    Endocrine: Negative for polydipsia, polyphagia and polyuria.  Genitourinary: Negative for dysuria, frequency, hematuria and urgency.  Musculoskeletal: Negative for back pain and neck stiffness.  Skin: Negative for rash.  Allergic/Immunologic: Negative for immunocompromised state.  Neurological: Positive for headaches. Negative for syncope.  Hematological: Does not bruise/bleed easily.  Psychiatric/Behavioral: Negative for sleep disturbance. The patient is not nervous/anxious.     Physical Exam Updated Vital Signs BP 132/76 (BP Location: Right Arm)   Pulse 78   Temp 98.6 F (37 C) (Oral)   Resp 20   SpO2 99%   Physical Exam Vitals and nursing note reviewed.  Constitutional:      General: She is not in acute distress.    Appearance: She is well-developed. She is not diaphoretic.  HENT:     Head: Normocephalic and atraumatic.     Right Ear: Ear canal and external ear normal. A middle ear effusion ( clear) is present.     Left Ear: Ear canal and external ear normal. A middle ear effusion ( clear) is present.     Nose: Mucosal edema present.     Right Turbinates: Swollen.     Left Turbinates: Swollen.  Eyes:     General: No scleral icterus.    Conjunctiva/sclera: Conjunctivae normal.     Pupils: Pupils are equal, round, and reactive to light.     Comments: No horizontal, vertical or rotational nystagmus  Neck:     Thyroid: No thyroid mass.     Comments: Full active and passive ROM without pain No midline or paraspinal tenderness No nuchal rigidity or meningeal signs Cardiovascular:     Rate and Rhythm: Normal rate and regular rhythm.  Pulmonary:     Effort: Pulmonary effort is normal. No respiratory distress.     Breath sounds: Normal breath sounds. No wheezing or rales.  Chest:     Chest wall: Tenderness ( Normal tenderness across the front of the chest) present.  Abdominal:     Palpations: Abdomen is soft.     Tenderness: There is no abdominal tenderness. There is no  guarding or rebound.  Musculoskeletal:        General: Normal range of motion.     Cervical back: Normal range of motion and neck supple.  Lymphadenopathy:     Cervical: No cervical adenopathy.  Skin:    General: Skin is warm and dry.     Findings: No rash.  Neurological:     Mental Status: She is alert and oriented to person, place, and time.     Cranial Nerves: No cranial nerve deficit.     Motor: No abnormal muscle tone.     Coordination: Coordination normal.     Comments: Mental Status:  Alert, oriented, thought content appropriate. Speech fluent without evidence of aphasia. Able to follow 2 step commands without difficulty.  Cranial Nerves:  II:  Peripheral visual fields grossly normal, pupils equal, round, reactive to light III,IV, VI: ptosis not present, extra-ocular motions intact bilaterally  V,VII: smile symmetric, facial light touch sensation equal VIII: hearing  grossly normal bilaterally  IX,X: midline uvula rise  XI: bilateral shoulder shrug equal and strong XII: midline tongue extension  Motor:  5/5 in upper and lower extremities bilaterally including strong and equal grip strength and dorsiflexion/plantar flexion Sensory: light touch normal in all extremities.  Cerebellar: normal finger-to-nose with bilateral upper extremities Gait: normal gait and balance CV: distal pulses palpable throughout   Psychiatric:        Behavior: Behavior normal.        Thought Content: Thought content normal.        Judgment: Judgment normal.     ED Results / Procedures / Treatments    Procedures Procedures (including critical care time)  Medications Ordered in ED Medications  prochlorperazine (COMPAZINE) injection 10 mg (10 mg Intramuscular Given 06/05/19 0058)  diphenhydrAMINE (BENADRYL) injection 12.5 mg (12.5 mg Intramuscular Given 06/05/19 0058)  dexamethasone (DECADRON) injection 10 mg (10 mg Intramuscular Given 06/05/19 0058)    ED Course  I have reviewed the triage  vital signs and the nursing notes.  Pertinent labs & imaging results that were available during my care of the patient were reviewed by me and considered in my medical decision making (see chart for details).    MDM Rules/Calculators/A&P                      Wilhelmenia Dowe was evaluated in Emergency Department on 06/05/2019 for the symptoms described in the history of present illness. She was evaluated in the context of the global COVID-19 pandemic, which necessitated consideration that the patient might be at risk for infection with the SARS-CoV-2 virus that causes COVID-19. Institutional protocols and algorithms that pertain to the evaluation of patients at risk for COVID-19 are in a state of rapid change based on information released by regulatory bodies including the CDC and federal and state organizations. These policies and algorithms were followed during the patient's care in the ED.  Presents with ongoing headache since diagnosis of Covid approximately 1 month ago.  Normal neurologic exam today.  She additionally has new and worsening nasal congestion and frontal headache consistent with potential acute bacterial sinusitis.  Additionally, patient has a history of migraine headaches but has not had one in a long time.  Some chance that patient's headache is secondary to trigger of migraine.  Will refer to neurology for further evaluation of headaches.  Will give azithromycin for sinusitis and Promethazine DM for cough.  Discussed reasons to return immediately to the emergency department including changes in vision, syncope, worsening symptoms or return of fevers in conjunction with a headache.  Patient dates understanding and is in agreement the plan  Final Clinical Impression(s) / ED Diagnoses Final diagnoses:  Generalized headache  Acute sinusitis, recurrence not specified, unspecified location  Cough    Rx / DC Orders ED Discharge Orders         Ordered    azithromycin (ZITHROMAX) 250 MG  tablet     06/05/19 0058    promethazine-dextromethorphan (PROMETHAZINE-DM) 6.25-15 MG/5ML syrup  4 times daily PRN     06/05/19 0058           Meloney Feld, Dahlia Client, PA-C 06/05/19 0655    Palumbo, April, MD 06/08/19 2354

## 2019-06-05 NOTE — ED Notes (Signed)
Pt discharge and prescription education provided. Pt verbalizes understanding. Pt is alert and oriented x 4 and ambulatory at discharge.  

## 2019-06-21 ENCOUNTER — Ambulatory Visit: Payer: BC Managed Care – PPO | Admitting: Family Medicine

## 2019-06-21 ENCOUNTER — Encounter: Payer: Self-pay | Admitting: Family Medicine

## 2019-06-21 VITALS — BP 120/78 | HR 74 | Ht 66.0 in | Wt 194.0 lb

## 2019-06-21 DIAGNOSIS — Z789 Other specified health status: Secondary | ICD-10-CM

## 2019-06-21 NOTE — Progress Notes (Signed)
  Subjective:     Patient ID: Katherine Dunlap, female   DOB: [birth year], 24 y.o.   MRN: [id number]  HPI Naziya presents to the employee health clinic today for her required wellness visit for her insurance. Her PCP is Dr. Horald Chestnut will establish care in April. Last pap smear done in March 2020 - normal per patient.  LMP 1/26-2/1, regular cycles - no birth control, one partner, uses condoms.   Social drinker   No past medical history on file. Allergies  Allergen Reactions  . Penicillins Hives and Itching    Did it involve swelling of the face/tongue/throat, SOB, or low BP? No Did it involve sudden or severe rash/hives, skin peeling, or any reaction on the inside of your mouth or nose? Yes Did you need to seek medical attention at a hospital or doctor's office? Yes When did it last happen?April 2020 If all above answers are "NO", may proceed with cephalosporin use.     Current Outpatient Medications:  .  fluticasone (FLONASE) 50 MCG/ACT nasal spray, Place 1 spray into both nostrils daily., Disp: , Rfl:  .  gabapentin (NEURONTIN) 300 MG capsule, Take 300 mg by mouth 3 (three) times daily., Disp: , Rfl:   Review of Systems  Constitutional: Negative for chills, fatigue, fever and unexpected weight change.  HENT: Negative for congestion, ear pain, sinus pressure, sinus pain and sore throat.   Eyes: Negative for discharge and visual disturbance.  Respiratory: Negative for cough, shortness of breath and wheezing.   Cardiovascular: Negative for chest pain and leg swelling.  Gastrointestinal: Negative for abdominal pain, blood in stool, constipation, diarrhea, nausea and vomiting.  Genitourinary: Negative for difficulty urinating and hematuria.  Skin: Negative for color change.  Neurological: Negative for dizziness, weakness, light-headedness and headaches.  Hematological: Negative for adenopathy.  Psychiatric/Behavioral: Negative for dysphoric mood and suicidal ideas.  All other  systems reviewed and are negative.      Objective:   Physical Exam Vitals reviewed.  Constitutional:      General: She is not in acute distress.    Appearance: She is well-developed.  HENT:     Head: Normocephalic and atraumatic.  Cardiovascular:     Rate and Rhythm: Normal rate and regular rhythm.     Heart sounds: Normal heart sounds. No murmur.  Pulmonary:     Effort: Pulmonary effort is normal. No respiratory distress.     Breath sounds: Normal breath sounds.  Musculoskeletal:     Cervical back: Neck supple.  Skin:    General: Skin is warm and dry.  Neurological:     Mental Status: She is alert and oriented to person, place, and time.  Psychiatric:        Mood and Affect: Mood normal.        Behavior: Behavior normal.    Today's Vitals   06/21/19 1634  BP: 120/78  Pulse: 74  SpO2: 98%  Weight: 194 lb (88 kg)  Height: 5\' 6"  (1.676 m)   Body mass index is 31.31 kg/m.      Assessment:     Participant in health and wellness plan      Plan:     1. Please keep appt to establish care with new PCP.  2. Discussed option of IUD for birth control. Encouraged to discuss this with PCP. Continue using condoms consistently.  3. F/u here prn.

## 2019-08-23 ENCOUNTER — Ambulatory Visit: Payer: BC Managed Care – PPO

## 2019-08-30 ENCOUNTER — Ambulatory Visit: Payer: BC Managed Care – PPO | Admitting: Family Medicine

## 2019-08-30 VITALS — BP 118/76 | HR 72 | Temp 97.5°F

## 2019-08-30 DIAGNOSIS — R300 Dysuria: Secondary | ICD-10-CM

## 2019-08-30 DIAGNOSIS — J0100 Acute maxillary sinusitis, unspecified: Secondary | ICD-10-CM

## 2019-08-30 LAB — POCT URINALYSIS DIPSTICK
Bilirubin, UA: NEGATIVE
Blood, UA: NEGATIVE
Glucose, UA: NEGATIVE
Ketones, UA: NEGATIVE
Leukocytes, UA: NEGATIVE
Nitrite, UA: NEGATIVE
Protein, UA: NEGATIVE
Spec Grav, UA: 1.02 (ref 1.010–1.025)
Urobilinogen, UA: 0.2 E.U./dL
pH, UA: 6 (ref 5.0–8.0)

## 2019-08-30 MED ORDER — DOXYCYCLINE HYCLATE 100 MG PO TABS: 100.0000 mg | ORAL_TABLET | Freq: Two times a day (BID) | ORAL | 0 refills | Status: DC

## 2019-08-30 NOTE — Progress Notes (Signed)
Subjective:     Patient ID: Katherine Dunlap, female   DOB: 19-Dec-1995, 24 y.o.   MRN: [id number]  HPI Katherine Dunlap presents to the employee health clinic today with c/o 3-4 week hx of frontal h/a, maxillary sinus pain, right ear fullness, sore throat, and dry cough. She denies fever or shortness of breath. She reports hx of seasonal allergies, she has been taking flonase and claritin with no relief. She gets weekly covid-19 testing here at work, last test on 4/21 was negative, she will be tested again tomorrow.  She also reports 2 day hx of vaginal burning/itching, dysuria, and a white/clear discharge. She denies hematuria, fever, flank pain. LMP: 08/20/19. She is sexually active, one female partner, sporadic condom use. She does have a GYN appt on 4/29 for birth control initiation.   Review of Systems  Constitutional: Negative for chills, fatigue and fever.  HENT: Positive for congestion, ear pain, postnasal drip, sinus pressure, sinus pain and sore throat. Negative for ear discharge, hearing loss and trouble swallowing.   Eyes: Negative for photophobia, discharge and itching.  Respiratory: Positive for cough. Negative for chest tightness, shortness of breath and wheezing.   Cardiovascular: Negative for chest pain.  Gastrointestinal: Negative for abdominal pain, blood in stool, diarrhea, nausea and vomiting.  Genitourinary: Positive for dysuria and vaginal discharge. Negative for difficulty urinating, flank pain, frequency, hematuria, pelvic pain and vaginal bleeding.  Skin: Negative for rash.  Neurological: Negative for dizziness, syncope and weakness.       Objective:   Physical Exam Vitals reviewed.  Constitutional:      General: She is not in acute distress.    Appearance: Normal appearance. She is not toxic-appearing.  HENT:     Head: Normocephalic and atraumatic.     Right Ear: Tympanic membrane normal.     Left Ear: Tympanic membrane normal.     Nose: Congestion present.     Right Sinus:  Maxillary sinus tenderness present.     Left Sinus: Maxillary sinus tenderness present.     Mouth/Throat:     Mouth: Mucous membranes are moist.     Pharynx: Oropharynx is clear.  Eyes:     General:        Right eye: No discharge.        Left eye: No discharge.  Cardiovascular:     Rate and Rhythm: Normal rate and regular rhythm.     Heart sounds: Normal heart sounds.  Pulmonary:     Effort: Pulmonary effort is normal. No respiratory distress.     Breath sounds: Normal breath sounds. No wheezing or rales.  Abdominal:     Tenderness: There is no right CVA tenderness or left CVA tenderness.  Musculoskeletal:     Cervical back: Neck supple. No rigidity.  Lymphadenopathy:     Cervical: No cervical adenopathy.  Skin:    General: Skin is warm and dry.  Neurological:     Mental Status: She is alert and oriented to person, place, and time.  Psychiatric:        Mood and Affect: Mood normal.        Behavior: Behavior normal.    Results for orders placed or performed in visit on 08/30/19  Urinalysis Dipstick  Result Value Ref Range   Color, UA yellow    Clarity, UA clear    Glucose, UA Negative Negative   Bilirubin, UA Negative    Ketones, UA Negative    Spec Grav, UA 1.020 1.010 - 1.025  Blood, UA Negative    pH, UA 6.0 5.0 - 8.0   Protein, UA Negative Negative   Urobilinogen, UA 0.2 0.2 or 1.0 E.U./dL   Nitrite, UA Negative    Leukocytes, UA Negative Negative   Appearance     Odor     Today's Vitals   08/30/19 1602  BP: 118/76  Pulse: 72  Temp: (!) 97.5 F (36.4 C)  SpO2: 98%   There is no height or weight on file to calculate BMI.     Assessment:     1. Acute non-recurrent maxillary sinusitis Given duration of symptoms and sinus tenderness will treat with abx, doxycycline 100 mg bid x 10 days. Discussed importance of completed course of antibiotics. May continue to take otc flonase and allergy medication. F/u if worsening symptoms or no improvement. Seek  emergent care if high fever, shortness of breath, severe h/a or any other concerning symptoms develop. She verbalized understanding.   2. Dysuria - Plan: Urinalysis Dipstick Urine dipstick today negative for infection. Discussed possibility of performing affirm testing today to check for yeast infection or BV. However, since she is seeing her GYN in 2 days and results will likely not be available until then she will wait to see her GYN and have a full pelvic exam done. Strongly encouraged her to also have screening for STIs done at that appt. Discussed importance of consistent condom use.  F/u here prn.      Plan:     See above

## 2019-10-11 ENCOUNTER — Ambulatory Visit: Payer: BC Managed Care – PPO | Admitting: Family Medicine

## 2019-10-11 VITALS — BP 116/76 | HR 64 | Temp 96.2°F | Resp 18

## 2019-10-11 DIAGNOSIS — J019 Acute sinusitis, unspecified: Secondary | ICD-10-CM

## 2019-10-11 MED ORDER — LEVOFLOXACIN 500 MG PO TABS: 500.0000 mg | ORAL_TABLET | Freq: Every day | ORAL | 0 refills | Status: DC

## 2019-10-11 NOTE — Patient Instructions (Signed)
- Try taking over the counter Afrin nasal spray (or store version is fine) for symptom relief of nasal congestion. Do not use for more than three days.  - Warm salt water gargles can help with any drainage in the back of the throat. As well as just plain saline nasal spray. - Try adding an over the counter antihistamine allergy medication, such as zyrtec (cetirizine) or allegra (fexofenadine), daily.    Sinusitis, Adult Sinusitis is inflammation of your sinuses. Sinuses are hollow spaces in the bones around your face. Your sinuses are located:  Around your eyes.  In the middle of your forehead.  Behind your nose.  In your cheekbones. Mucus normally drains out of your sinuses. When your nasal tissues become inflamed or swollen, mucus can become trapped or blocked. This allows bacteria, viruses, and fungi to grow, which leads to infection. Most infections of the sinuses are caused by a virus. Sinusitis can develop quickly. It can last for up to 4 weeks (acute) or for more than 12 weeks (chronic). Sinusitis often develops after a cold. What are the causes? This condition is caused by anything that creates swelling in the sinuses or stops mucus from draining. This includes:  Allergies.  Asthma.  Infection from bacteria or viruses.  Deformities or blockages in your nose or sinuses.  Abnormal growths in the nose (nasal polyps).  Pollutants, such as chemicals or irritants in the air.  Infection from fungi (rare). What increases the risk? You are more likely to develop this condition if you:  Have a weak body defense system (immune system).  Do a lot of swimming or diving.  Overuse nasal sprays.  Smoke. What are the signs or symptoms? The main symptoms of this condition are pain and a feeling of pressure around the affected sinuses. Other symptoms include:  Stuffy nose or congestion.  Thick drainage from your nose.  Swelling and warmth over the affected  sinuses.  Headache.  Upper toothache.  A cough that may get worse at night.  Extra mucus that collects in the throat or the back of the nose (postnasal drip).  Decreased sense of smell and taste.  Fatigue.  A fever.  Sore throat.  Bad breath. How is this diagnosed? This condition is diagnosed based on:  Your symptoms.  Your medical history.  A physical exam.  Tests to find out if your condition is acute or chronic. This may include: ? Checking your nose for nasal polyps. ? Viewing your sinuses using a device that has a light (endoscope). ? Testing for allergies or bacteria. ? Imaging tests, such as an MRI or CT scan. In rare cases, a bone biopsy may be done to rule out more serious types of fungal sinus disease. How is this treated? Treatment for sinusitis depends on the cause and whether your condition is chronic or acute.  If caused by a virus, your symptoms should go away on their own within 10 days. You may be given medicines to relieve symptoms. They include: ? Medicines that shrink swollen nasal passages (topical intranasal decongestants). ? Medicines that treat allergies (antihistamines). ? A spray that eases inflammation of the nostrils (topical intranasal corticosteroids). ? Rinses that help get rid of thick mucus in your nose (nasal saline washes).  If caused by bacteria, your health care provider may recommend waiting to see if your symptoms improve. Most bacterial infections will get better without antibiotic medicine. You may be given antibiotics if you have: ? A severe infection. ? A  weak immune system.  If caused by narrow nasal passages or nasal polyps, you may need to have surgery. Follow these instructions at home: Medicines  Take, use, or apply over-the-counter and prescription medicines only as told by your health care provider. These may include nasal sprays.  If you were prescribed an antibiotic medicine, take it as told by your health care  provider. Do not stop taking the antibiotic even if you start to feel better. Hydrate and humidify   Drink enough fluid to keep your urine pale yellow. Staying hydrated will help to thin your mucus.  Use a cool mist humidifier to keep the humidity level in your home above 50%.  Inhale steam for 10-15 minutes, 3-4 times a day, or as told by your health care provider. You can do this in the bathroom while a hot shower is running.  Limit your exposure to cool or dry air. Rest  Rest as much as possible.  Sleep with your head raised (elevated).  Make sure you get enough sleep each night. General instructions   Apply a warm, moist washcloth to your face 3-4 times a day or as told by your health care provider. This will help with discomfort.  Wash your hands often with soap and water to reduce your exposure to germs. If soap and water are not available, use hand sanitizer.  Do not smoke. Avoid being around people who are smoking (secondhand smoke).  Keep all follow-up visits as told by your health care provider. This is important. Contact a health care provider if:  You have a fever.  Your symptoms get worse.  Your symptoms do not improve within 10 days. Get help right away if:  You have a severe headache.  You have persistent vomiting.  You have severe pain or swelling around your face or eyes.  You have vision problems.  You develop confusion.  Your neck is stiff.  You have trouble breathing. Summary  Sinusitis is soreness and inflammation of your sinuses. Sinuses are hollow spaces in the bones around your face.  This condition is caused by nasal tissues that become inflamed or swollen. The swelling traps or blocks the flow of mucus. This allows bacteria, viruses, and fungi to grow, which leads to infection.  If you were prescribed an antibiotic medicine, take it as told by your health care provider. Do not stop taking the antibiotic even if you start to feel  better.  Keep all follow-up visits as told by your health care provider. This is important. This information is not intended to replace advice given to you by your health care provider. Make sure you discuss any questions you have with your health care provider. Document Revised: 09/21/2017 Document Reviewed: 09/21/2017 Elsevier Patient Education  [employer].

## 2019-10-11 NOTE — Progress Notes (Signed)
Subjective:     Patient ID: Katherine Dunlap, female   DOB: May 02, 1996, 24 y.o.   MRN: [id number]  HPI Avangeline presents to the employee health clinic for evaluation of her sinus symptoms. She was last seen here for the same on 08/30/19 and treated with a course of doxycycline x 10 days. She reports completing the abx as prescribed with no improvement in symptoms. She reports continued nasal congestion and maxillary sinus tenderness, worse on the left side. Also with dry cough, frontal h/a, and bilateral ear pain/stuffiness, as well as changes to her smell/taste. She denies fever, shortness of breath, N/V, abdominal pain, vision changes, neck pain. She reports her covid-19 testing at work has been negative. She tried taking flonase with no improvement in her symptoms, has not tried any other otc medications except ibuprofen which relieves her h/a.    Review of Systems  Constitutional: Negative for activity change, chills and fever.  HENT: Positive for congestion, ear pain, sinus pressure and sinus pain. Negative for dental problem, ear discharge, facial swelling, hearing loss, sore throat, tinnitus and trouble swallowing.   Eyes: Negative for photophobia, discharge and visual disturbance.  Respiratory: Positive for cough. Negative for chest tightness, shortness of breath and wheezing.   Cardiovascular: Negative for chest pain.  Gastrointestinal: Negative for abdominal pain, nausea and vomiting.  Skin: Negative for color change.  Neurological: Positive for headaches. Negative for dizziness and weakness.       Objective:   Physical Exam Vitals reviewed.  Constitutional:      General: She is not in acute distress.    Appearance: Normal appearance. She is not toxic-appearing.  HENT:     Head: Normocephalic and atraumatic.     Right Ear: A middle ear effusion is present. Tympanic membrane is not erythematous.     Left Ear: A middle ear effusion is present. Tympanic membrane is not erythematous.     Nose:  Mucosal edema present.     Left Turbinates: Enlarged.     Right Sinus: Maxillary sinus tenderness present. No frontal sinus tenderness.     Left Sinus: Maxillary sinus tenderness present. No frontal sinus tenderness.     Mouth/Throat:     Mouth: Mucous membranes are moist.     Pharynx: Oropharynx is clear. Uvula midline.  Eyes:     General:        Right eye: No discharge.        Left eye: No discharge.  Cardiovascular:     Rate and Rhythm: Normal rate and regular rhythm.     Heart sounds: Normal heart sounds.  Pulmonary:     Effort: Pulmonary effort is normal. No respiratory distress.     Breath sounds: Normal breath sounds. No wheezing or rales.  Musculoskeletal:     Cervical back: Neck supple. No rigidity or tenderness.  Lymphadenopathy:     Cervical: No cervical adenopathy.  Skin:    General: Skin is warm and dry.  Neurological:     Mental Status: She is alert and oriented to person, place, and time.  Psychiatric:        Mood and Affect: Mood normal.        Behavior: Behavior normal.    Today's Vitals   10/11/19 1538  BP: 116/76  Pulse: 64  Resp: 18  Temp: (!) 96.2 F (35.7 C)  SpO2: 98%   There is no height or weight on file to calculate BMI.      Assessment:     Acute  rhinosinusitis      Plan:     1. Sinusitis not responsive to treatment with doxycycline. Given pcn allergy will trial levaquin x 7 days. Instructed on risks and benefits of treatment. Recommend afrin nasal spray for symptomatic relief for no more than 3 days duration. Also recommend starting on oral antihistamine such as fexofenadine or cetirizine daily. If symptoms do not improve or worsen recommend referral to specialist, she should see her PCP for referral.

## 2020-02-26 IMAGING — CR DG CHEST 2V
2 series · 2 of 2 positions shown · non-contrast
Comparison: None.

CLINICAL DATA: 22 y/o F; palpitations, shortness of breath, and
sore throat for 1 week.

EXAM:
CHEST - 2 VIEW

[chest pa]
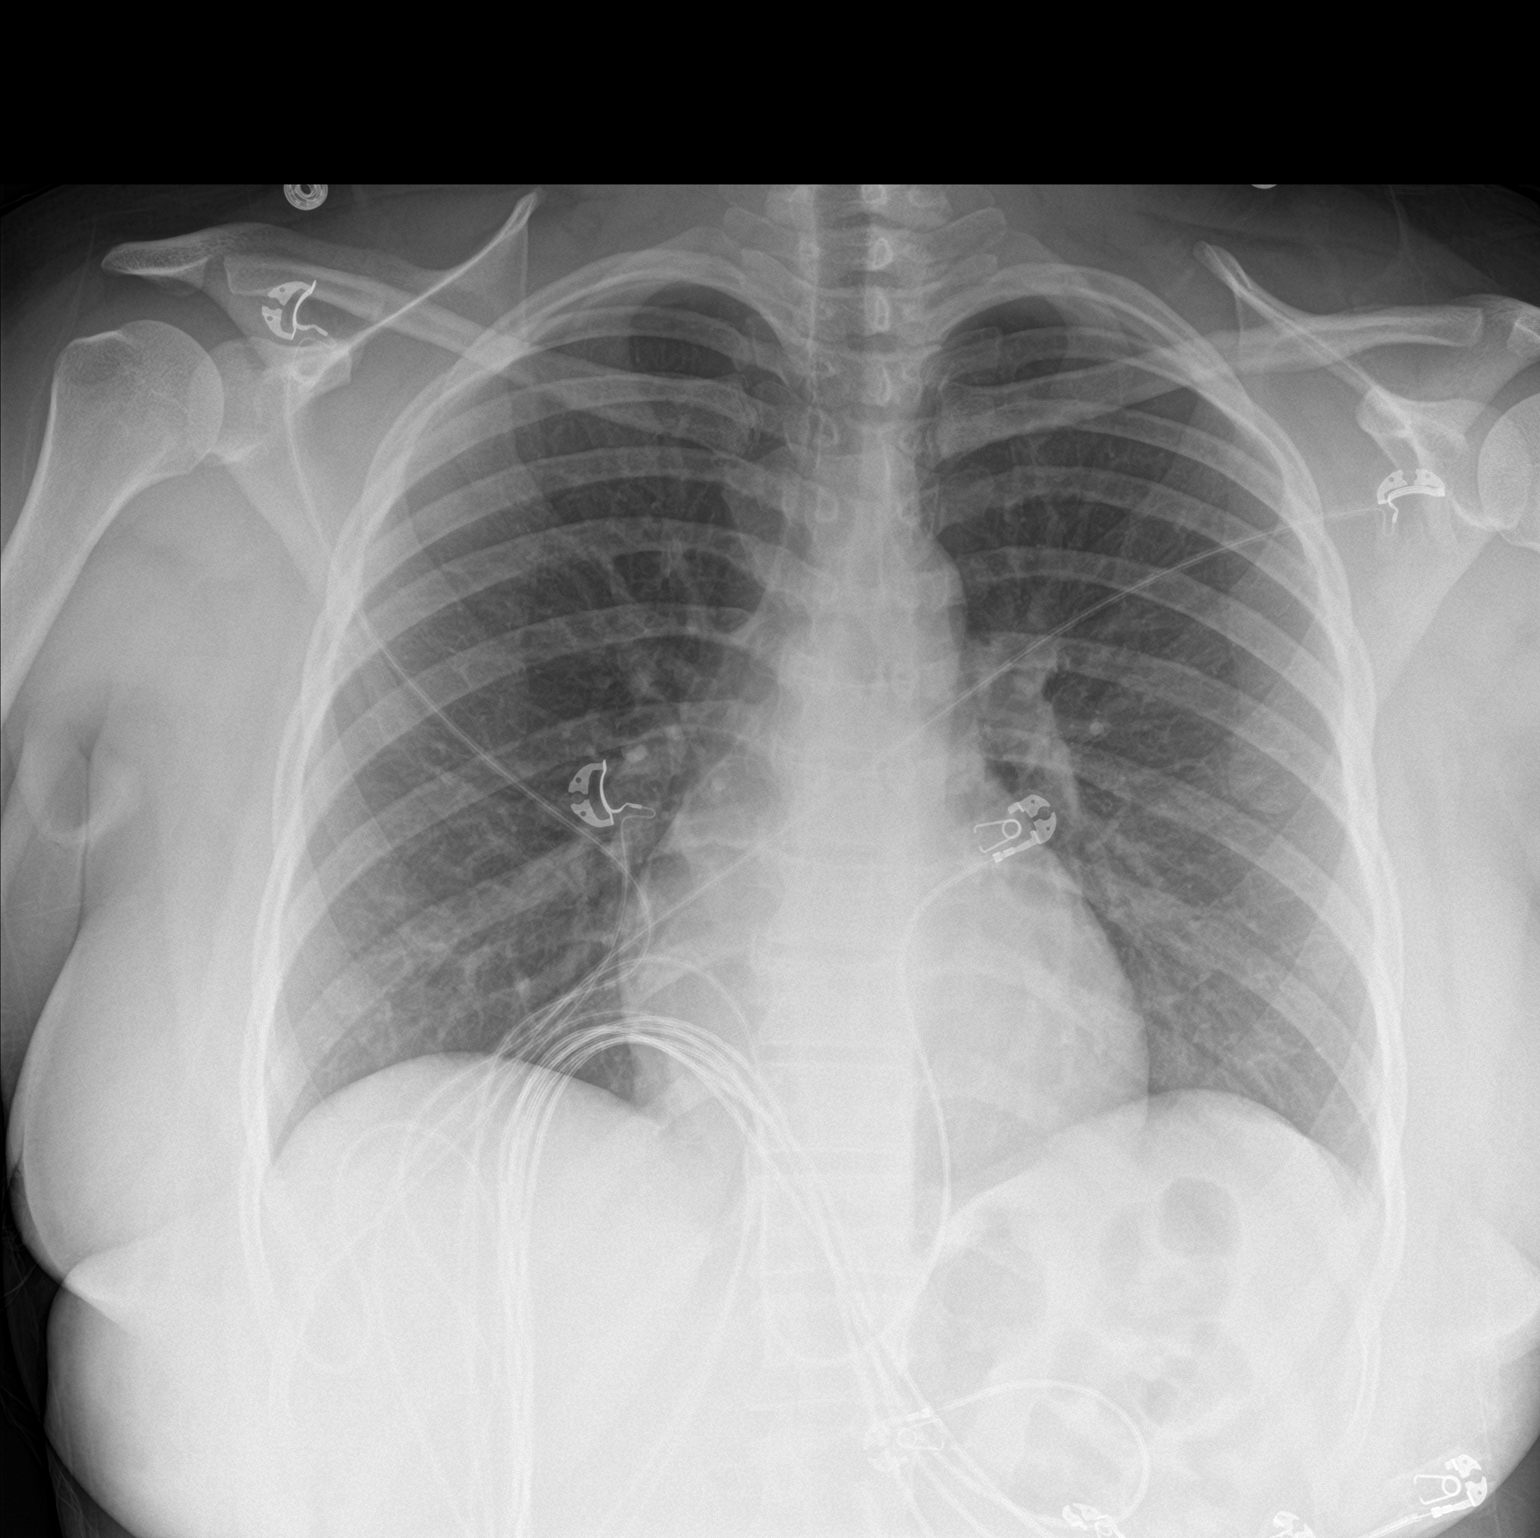

[chest lat]
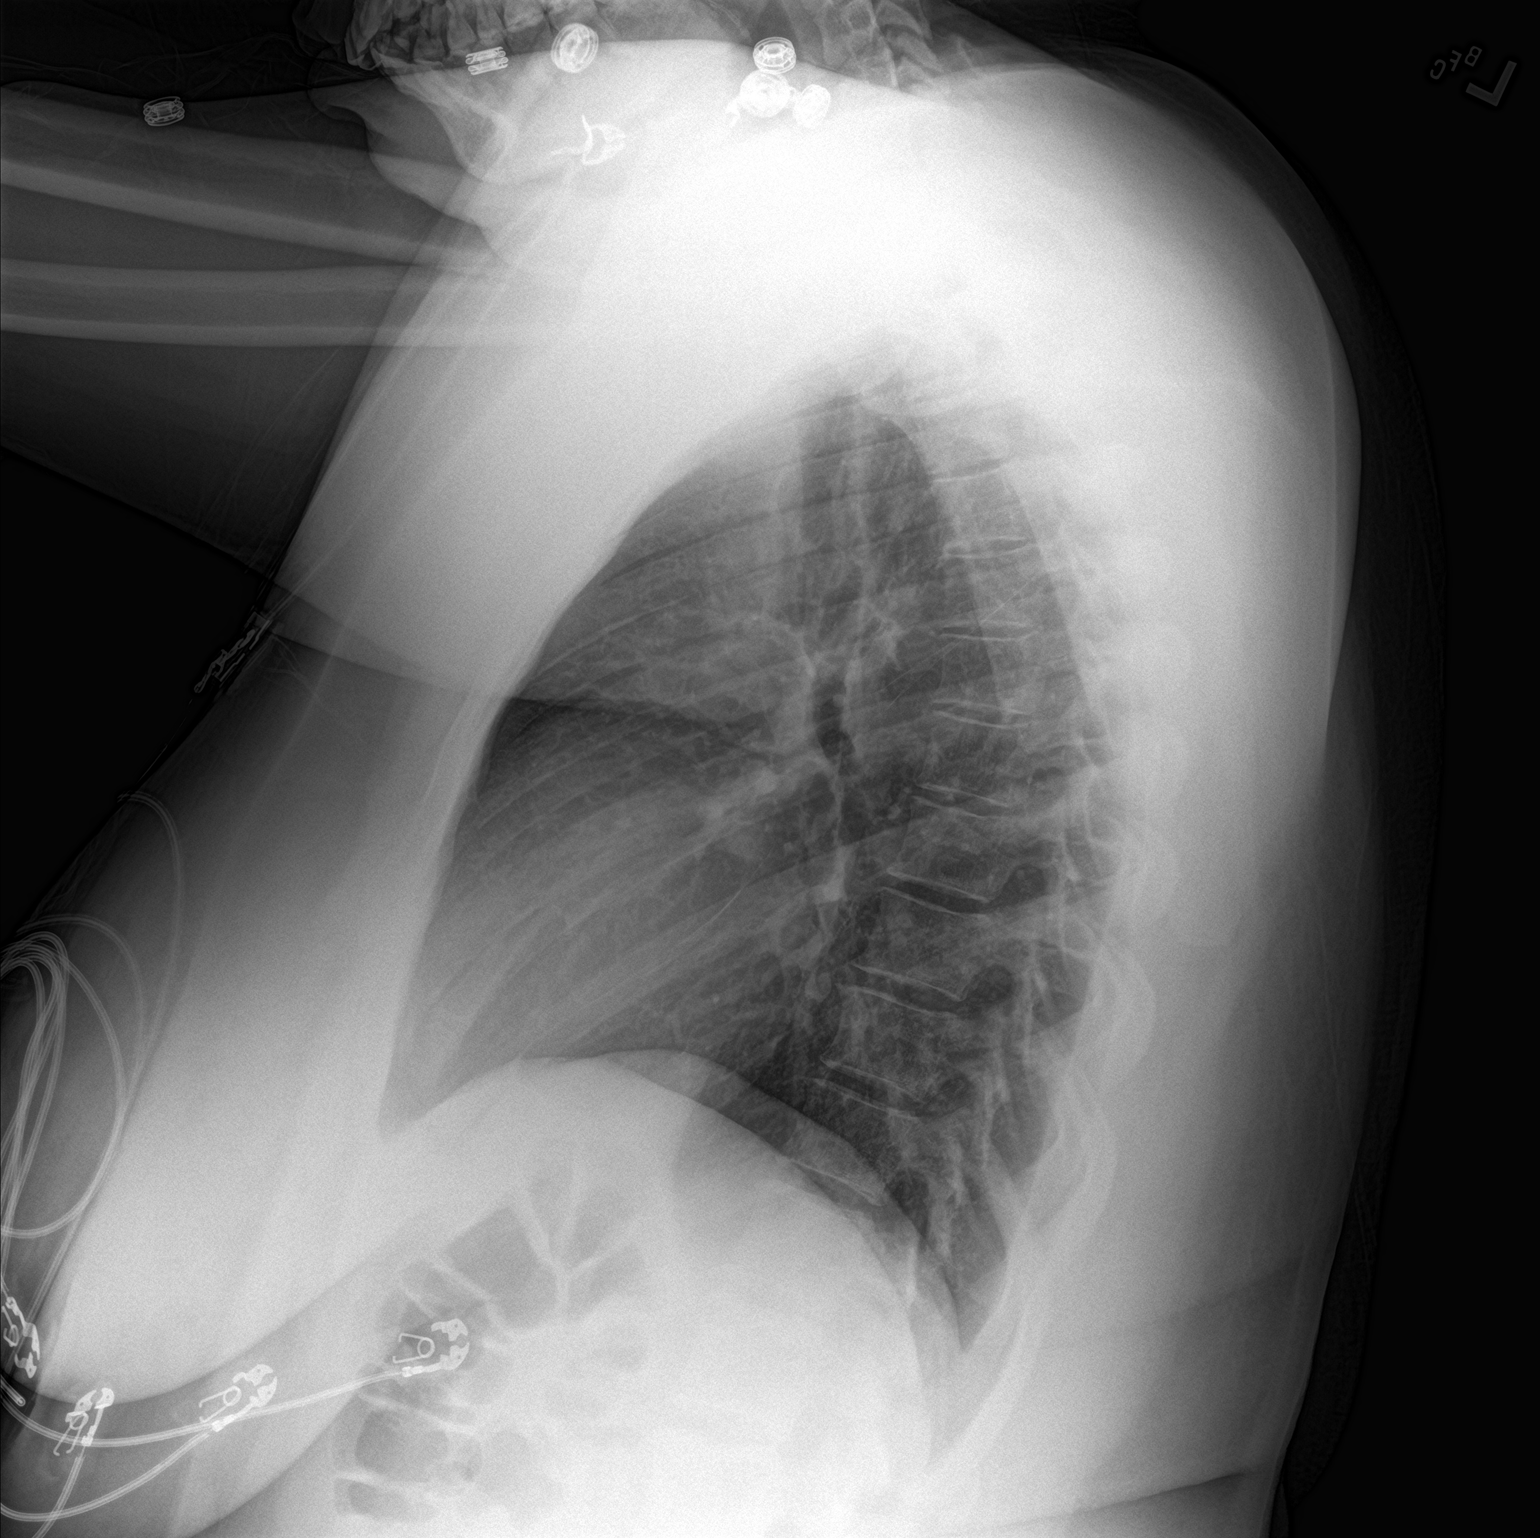

[2 of 2 positions shown; findings below may reference images not displayed]

FINDINGS: The heart size and mediastinal contours are within normal limits.
Both lungs are clear. The visualized skeletal structures are
unremarkable.
IMPRESSION: No acute pulmonary process identified.

## 2020-08-28 ENCOUNTER — Ambulatory Visit: Payer: BC Managed Care – PPO | Admitting: Family Medicine

## 2020-08-28 VITALS — BP 104/70 | HR 78

## 2020-08-28 DIAGNOSIS — J302 Other seasonal allergic rhinitis: Secondary | ICD-10-CM

## 2020-08-28 NOTE — Progress Notes (Signed)
Subjective:     Patient ID: Katherine Dunlap, female   DOB: 04/08/1996, 25 y.o.   MRN: [id number]  HPI  Katherine Dunlap presents to the employee health and wellness clinic today for evaluation of URI symptoms x 1-2 weeks. She reports started with dry cough and sore throat and has been having intermittent right ear pain. Also reports some congestion and PND. She denies fever or shortness of breath. She was tested for covid-19 2 days ago and was negative per pt. She reports she has been vaccinated but has not yet received the booster dose. She denies any sick contacts. She does have a hx of environmental allergies and recurrent sinus infections. She reports being seen by ENT recently and was told she may need allergy testing. She is currently taking otc loratadine daily, and has flonase but is not taking daily.   No past medical history on file. Allergies  Allergen Reactions  . Penicillins Hives and Itching    Did it involve swelling of the face/tongue/throat, SOB, or low BP? No Did it involve sudden or severe rash/hives, skin peeling, or any reaction on the inside of your mouth or nose? Yes Did you need to seek medical attention at a hospital or doctor's office? Yes When did it last happen?April 2020 If all above answers are "NO", may proceed with cephalosporin use.     Current Outpatient Medications:  .  BLISOVI 24 FE 1-20 MG-MCG(24) tablet, Take 1 tablet by mouth daily., Disp: , Rfl:  .  fluticasone (FLONASE) 50 MCG/ACT nasal spray, Place into both nostrils daily., Disp: , Rfl:  .  gabapentin (NEURONTIN) 300 MG capsule, Take 300 mg by mouth 3 (three) times daily., Disp: , Rfl:  .  loratadine (CLARITIN) 10 MG tablet, Take 10 mg by mouth daily., Disp: , Rfl:      Review of Systems  Constitutional: Negative for activity change, appetite change, chills and fever.  HENT: Positive for congestion, ear pain, postnasal drip and sore throat. Negative for ear discharge, facial swelling, hearing loss, sinus  pressure, sinus pain, tinnitus and trouble swallowing.   Eyes: Negative for pain, discharge and redness.  Respiratory: Positive for cough. Negative for chest tightness, shortness of breath and wheezing.   Cardiovascular: Negative for chest pain.  Gastrointestinal: Negative for abdominal pain, diarrhea, nausea and vomiting.  Musculoskeletal: Negative for neck pain and neck stiffness.  Skin: Negative for color change and rash.  Allergic/Immunologic: Positive for environmental allergies.  Neurological: Negative for dizziness, weakness and headaches.       Objective:   Physical Exam Vitals reviewed.  Constitutional:      General: She is not in acute distress.    Appearance: Normal appearance. She is well-developed.  HENT:     Head: Normocephalic and atraumatic.     Right Ear: A middle ear effusion is present. Tympanic membrane is not erythematous, retracted or bulging.     Left Ear: A middle ear effusion is present. Tympanic membrane is not erythematous, retracted or bulging.     Nose: Mucosal edema and congestion present.     Right Sinus: No maxillary sinus tenderness or frontal sinus tenderness.     Left Sinus: No maxillary sinus tenderness or frontal sinus tenderness.  Eyes:     General:        Right eye: No discharge.        Left eye: No discharge.  Cardiovascular:     Rate and Rhythm: Normal rate and regular rhythm.     Heart  sounds: Normal heart sounds.  Pulmonary:     Effort: Pulmonary effort is normal. No respiratory distress.     Breath sounds: Normal breath sounds.  Musculoskeletal:     Cervical back: Neck supple.  Lymphadenopathy:     Cervical: No cervical adenopathy.  Skin:    General: Skin is warm and dry.  Neurological:     Mental Status: She is alert and oriented to person, place, and time.  Psychiatric:        Mood and Affect: Mood normal.        Behavior: Behavior normal.    Today's Vitals   08/28/20 1535  BP: 104/70  Pulse: 78  SpO2: 98%   There is  no height or weight on file to calculate BMI.     Assessment:     Seasonal allergic rhinitis, unspecified trigger      Plan:     1. No evidence of infection at this time. Recommend symptomatic treatment with otc antihistamine/decongestant combo. Since loratadine has not been helpful, recommend trial of fexofenadine with the addition of pseudoephedrine short term to help relieve her congestion and ear effusion. Also recommend using her flonase everyday, 2 sprays each nare daily. If her symptoms do not improve recommend f/u with allergist for allergy testing and potentially need for allergy immunotherapy. If symptoms worsen, develop worsening cough, fever, shortness of breath she should be rechecked either here or with PCP or urgent care.

## 2020-09-25 ENCOUNTER — Ambulatory Visit: Payer: BC Managed Care – PPO | Admitting: Family

## 2020-09-25 ENCOUNTER — Other Ambulatory Visit: Payer: Self-pay

## 2020-09-25 VITALS — BP 128/71 | HR 84 | Temp 98.9°F | Resp 18 | Ht 66.0 in | Wt 190.0 lb

## 2020-09-25 DIAGNOSIS — J0100 Acute maxillary sinusitis, unspecified: Secondary | ICD-10-CM

## 2020-09-25 MED ORDER — SULFAMETHOXAZOLE-TRIMETHOPRIM 800-160 MG PO TABS: 1.0000 | ORAL_TABLET | Freq: Two times a day (BID) | ORAL | 0 refills | Status: AC

## 2020-09-25 NOTE — Progress Notes (Signed)
Established Patient Office Visit  Subjective:  Patient ID: Katherine Dunlap, female    DOB: 1995/09/15  Age: 25 y.o. MRN: [id number]  CC: Sinusitis  HPI Katherine Dunlap presents for complaints of congestion, left ear pain and cough.   History reviewed. No pertinent past medical history.  Past Surgical History:  Procedure Laterality Date  . MANDIBLE SURGERY      History reviewed. No pertinent family history.  Social History   Socioeconomic History  . Marital status: Single    Spouse name: Not on file  . Number of children: Not on file  . Years of education: Not on file  . Highest education level: Not on file  Occupational History  . Not on file  Tobacco Use  . Smoking status: Never Smoker  . Smokeless tobacco: Never Used  Substance and Sexual Activity  . Alcohol use: Yes  . Drug use: Never  . Sexual activity: Not on file  Other Topics Concern  . Not on file  Social History Narrative  . Not on file   Social Determinants of Health   Financial Resource Strain: Not on file  Food Insecurity: Not on file  Transportation Needs: Not on file  Physical Activity: Not on file  Stress: Not on file  Social Connections: Not on file  Intimate Partner Violence: Not on file    Outpatient Medications Prior to Visit  Medication Sig Dispense Refill  . BLISOVI 24 FE 1-20 MG-MCG(24) tablet Take 1 tablet by mouth daily.    . fluticasone (FLONASE) 50 MCG/ACT nasal spray Place into both nostrils daily.    Marland Kitchen gabapentin (NEURONTIN) 300 MG capsule Take 300 mg by mouth 3 (three) times daily.    Marland Kitchen loratadine (CLARITIN) 10 MG tablet Take 10 mg by mouth daily.     No facility-administered medications prior to visit.    Allergies  Allergen Reactions  . Penicillins Hives and Itching    Did it involve swelling of the face/tongue/throat, SOB, or low BP? No Did it involve sudden or severe rash/hives, skin peeling, or any reaction on the inside of your mouth or nose? Yes Did you need to seek medical  attention at a hospital or doctor's office? Yes When did it last happen?April 2020 If all above answers are "NO", may proceed with cephalosporin use.     ROS Review of Systems  Constitutional: Negative.   HENT: Positive for congestion, ear pain, postnasal drip, sinus pressure and sore throat.   Eyes: Negative.   Respiratory: Negative.   Cardiovascular: Negative.   Endocrine: Negative.   Genitourinary: Negative.   Allergic/Immunologic: Positive for environmental allergies.  Neurological: Negative.       Objective:    Physical Exam Vitals reviewed.  Constitutional:      Appearance: Normal appearance.  HENT:     Nose: Congestion and rhinorrhea present.     Mouth/Throat:     Pharynx: Posterior oropharyngeal erythema present.  Cardiovascular:     Rate and Rhythm: Normal rate and regular rhythm.     Pulses: Normal pulses.     Heart sounds: Normal heart sounds.  Pulmonary:     Effort: Pulmonary effort is normal.     Breath sounds: Normal breath sounds and air entry.  Musculoskeletal:     Cervical back: Normal range of motion.  Lymphadenopathy:     Cervical: Cervical adenopathy present.     Right cervical: Superficial cervical adenopathy present.     Left cervical: Superficial cervical adenopathy present.  Skin:  General: Skin is warm and dry.  Neurological:     Mental Status: She is alert.     BP 128/71   Pulse 84   Temp 98.9 F (37.2 C) (Skin)   Resp 18   Ht 5\' 6"  (1.676 m)   Wt 190 lb (86.2 kg)   SpO2 98%   BMI 30.67 kg/m  Wt Readings from Last 3 Encounters:  09/25/20 190 lb (86.2 kg)  06/21/19 194 lb (88 kg)  04/17/18 245 lb 15.8 oz (111.6 kg)     Health Maintenance Due  Topic Date Due  . COVID-19 Vaccine (1) Never done  . HPV VACCINES (1 - 2-dose series) Never done  . Hepatitis C Screening  Never done  . PAP-Cervical Cytology Screening  Never done  . PAP SMEAR-Modifier  Never done       Topic Date Due  . HPV VACCINES (1 - 2-dose  series) Never done    No results found for: TSH Lab Results  Component Value Date   WBC 3.3 (L) 01/25/2019   HGB 12.5 01/25/2019   HCT 39.3 01/25/2019   MCV 92.5 01/25/2019   PLT 332 01/25/2019   Lab Results  Component Value Date   NA 139 01/25/2019   K 3.5 01/25/2019   CO2 24 01/25/2019   GLUCOSE 99 01/25/2019   BUN 8 01/25/2019   CREATININE 0.75 01/25/2019   BILITOT 0.4 01/25/2019   ALKPHOS 53 01/25/2019   AST 16 01/25/2019   ALT 13 01/25/2019   PROT 7.5 01/25/2019   ALBUMIN 4.2 01/25/2019   CALCIUM 9.4 01/25/2019   ANIONGAP 10 01/25/2019   No results found for: CHOL No results found for: HDL No results found for: LDLCALC No results found for: TRIG No results found for: CHOLHDL No results found for: 01/27/2019    Assessment & Plan:   Problem List Items Addressed This Visit   None   Visit Diagnoses    Acute non-recurrent maxillary sinusitis    -  Primary     Was tested twice this week for Covid 19 by her employer.  The last Covid test was done yesterday - all negative.   Acute sinusitis- will treat with Cefdinir 300 mg twice a day for 10 days.   Finish all antibiotics.  Drink plenty of fluids.    Follow-up: Return if symptoms worsen or fail to improve.    [id number], NP

## 2020-11-14 ENCOUNTER — Telehealth: Payer: BC Managed Care – PPO | Admitting: Family Medicine

## 2020-11-14 ENCOUNTER — Encounter: Payer: Self-pay | Admitting: Family Medicine

## 2020-11-14 DIAGNOSIS — H9202 Otalgia, left ear: Secondary | ICD-10-CM | POA: Diagnosis not present

## 2020-11-14 DIAGNOSIS — J0110 Acute frontal sinusitis, unspecified: Secondary | ICD-10-CM

## 2020-11-14 MED ORDER — AZITHROMYCIN 250 MG PO TABS: ORAL_TABLET | ORAL | 0 refills | Status: AC

## 2020-11-14 MED ORDER — FLUTICASONE PROPIONATE 50 MCG/ACT NA SUSP: 2.0000 | Freq: Every day | NASAL | 6 refills | Status: DC

## 2020-11-14 NOTE — Patient Instructions (Signed)
Today you were seen for URI/Sinus Infection.  Please take the prescribed medication as directed and complete the full dose to prevent reinfection.  Please eat yogurt or take a probiotic if possible to avoid a yeast infection (this is common) from antibiotic. Should you develop a infection call the office or send a message in MyChart to let us know. We will address it quickly.  As suggested for symptom management use Nasal saline spray to help ease congestion and dry nasal passages. Can be used throughout the day as needed. Avoid forceful blowing of nose. It will be common to have some blood if blowing nose a lot or if nose is very dry. Can you a humidifier at home as well. Remember to wash and air it out regularly. Tylenol (325 mg 2 tablets every 6 hours) and or Ibuprofen (200- 400 mg every 8 hours) for fever, sore throat and body aches. Can consider use of a Neti-pot to wash out sinus cavity (twice daily). Please be aware that you need to use distilled or boiled water for these. If congestion is increasing and or coughing can use Mucinex (twice daily) for cough and congestion with full glass of water.  If you have a sore throat warm fluids like hot tea or lemon water can help sooth this.    Please hydrate, rest, get fresh air daily and wash your hands well.  I hope you feel better soon.

## 2020-11-14 NOTE — Progress Notes (Signed)
[name], Katherine Dunlap are scheduled for a virtual visit with your provider today.    Just as we do with appointments in the office, we must obtain your consent to participate.  Your consent will be active for this visit and any virtual visit you may have with one of our providers in the next 365 days.    If you have a MyChart account, I can also send a copy of this consent to you electronically.  All virtual visits are billed to your insurance company just like a traditional visit in the office.  As this is a virtual visit, video technology does not allow for your provider to perform a traditional examination.  This may limit your provider's ability to fully assess your condition.  If your provider identifies any concerns that need to be evaluated in person or the need to arrange testing such as labs, EKG, etc, we will make arrangements to do so.    Although advances in technology are sophisticated, we cannot ensure that it will always work on either your end or our end.  If the connection with a video visit is poor, we may have to switch to a telephone visit.  With either a video or telephone visit, we are not always able to ensure that we have a secure connection.   I need to obtain your verbal consent now.   Are you willing to proceed with your visit today?   Katherine Dunlap has provided verbal consent on 11/14/2020 for a virtual visit (video or telephone).   Katherine Finner, NP 11/14/2020  7:02 PM   Date:  11/14/2020   ID:  Katherine Dunlap, DOB [birth year], MRN [id number]  Patient Location: Home Provider Location: Home Office   Participants: Patient and Provider for Visit and Wrap up  Method of visit: Video  Location of Patient: Home Location of Provider: Home Office Consent was obtain for visit over the video. Services rendered by provider: Visit was performed via video  A video enabled telemedicine application was used and I verified that I am speaking with the correct person using two identifiers.  PCP:   Katherine Channel, NP   Chief Complaint:  sinus infection   History of Present Illness:    Katherine Dunlap is a 25 y.o. female with history as stated below. Presents video telehealth for an acute care visit secondary to sinus infection. She reports having really bad ear pain in my left ear like I have a ear infection.  She reports she is prone to sinus infections.She also as sore throat and nasal drainage. Onset was 5 days ago.      Denies having fevers, chills, shortness of breath, cough, chest pain,  or exposure to covid or other sick contacts.  COVID negative  Modifying factors include: Otc sinus meds No other aggravating or relieving factors.  No other c/o.  Past Medical, Surgical, Social History, Allergies, and Medications have been Reviewed.  No past medical history on file.  No outpatient medications have been marked as taking for the 11/14/20 encounter (Appointment) with Katherine Finner, NP.     Allergies:   Penicillins   ROS See HPI for history of present illness.  Physical Exam HENT:     Head: Normocephalic.     Comments: Tenderness to touch of frontal and max sinus- pt performed during video visit    Nose: Nose normal.  Eyes:     Conjunctiva/sclera: Conjunctivae normal.  Musculoskeletal:        General: Normal range  of motion.     Cervical back: Normal range of motion.  Skin:    Coloration: Skin is not jaundiced.  Neurological:     General: No focal deficit present.     Mental Status: She is alert.  Psychiatric:        Mood and Affect: Mood normal.        Behavior: Behavior normal.        Thought Content: Thought content normal.        Judgment: Judgment normal.              A&P  1. Acute non-recurrent frontal sinusitis S&S are consistent with sinus infection -OTC measures reviewed Zpak and Flonase provided  Reviewed side effects, risks and benefits of medication.   Patient acknowledged agreement and understanding of the plan.   - azithromycin  (ZITHROMAX) 250 MG tablet; Take 2 tablets on day 1, then 1 tablet daily on days 2 through 5  Dispense: 6 tablet; Refill: 0 - fluticasone (FLONASE) 50 MCG/ACT nasal spray; Place 2 sprays into both nostrils daily.  Dispense: 16 g; Refill: 6  2. Left ear pain -zpak should cover if infection is present Flonase should help open eustachian tubes   Reviewed side effects, risks and benefits of medication.   Patient acknowledged agreement and understanding of the plan.     Time:   Today, I have spent 10 minutes with the patient with telehealth technology discussing the above problems, reviewing the chart, previous notes, medications and orders.   Medication Changes: No orders of the defined types were placed in this encounter.    Disposition:  Follow up as needed Signed, Katherine Finner, NP  11/14/2020 7:02 PM

## 2020-11-15 ENCOUNTER — Encounter: Payer: Self-pay | Admitting: Family Medicine

## 2020-11-21 ENCOUNTER — Telehealth: Payer: BC Managed Care – PPO | Admitting: Family Medicine

## 2020-11-21 DIAGNOSIS — L219 Seborrheic dermatitis, unspecified: Secondary | ICD-10-CM

## 2020-11-21 MED ORDER — PREDNISONE 10 MG PO TABS: ORAL_TABLET | ORAL | 0 refills | Status: AC

## 2020-11-21 NOTE — Progress Notes (Signed)
Katherine Dunlap, Katherine Dunlap are scheduled for a virtual visit with your provider today.    Just as we do with appointments in the office, we must obtain your consent to participate.  Your consent will be active for this visit and any virtual visit you may have with one of our providers in the next 365 days.    If you have a MyChart account, I can also send a copy of this consent to you electronically.  All virtual visits are billed to your insurance company just like a traditional visit in the office.  As this is a virtual visit, video technology does not allow for your provider to perform a traditional examination.  This may limit your provider's ability to fully assess your condition.  If your provider identifies any concerns that need to be evaluated in person or the need to arrange testing such as labs, EKG, etc, we will make arrangements to do so.    Although advances in technology are sophisticated, we cannot ensure that it will always work on either your end or our end.  If the connection with a video visit is poor, we may have to switch to a telephone visit.  With either a video or telephone visit, we are not always able to ensure that we have a secure connection.   I need to obtain your verbal consent now.   Are you willing to proceed with your visit today?   Katherine Dunlap has provided verbal consent on 11/21/2020 for a virtual visit (video or telephone).   Katherine Finner, NP 11/21/2020  5:28 PM   Date:  11/21/2020   ID:  Katherine Dunlap, DOB 08/16/1995, MRN [id number]  Patient Location: Home Provider Location: Home Office   Participants: Patient and Provider for Visit and Wrap up  Method of visit: Video  Location of Patient: Home Location of Provider: Home Office Consent was obtain for visit over the video. Services rendered by provider: Visit was performed via video  A video enabled telemedicine application was used and I verified that I am speaking with the correct person using two identifiers.  PCP:   Katherine Channel, NP   Chief Complaint:  dermatitis   History of Present Illness:    Katherine Dunlap is a 25 y.o. female with history as stated below. Presents video telehealth for an acute care visit due to dermatitis.  She reports trying to have synthetic hair placed again, but developed discomfort. She removed d/t discomfort and is now having on going burning, itching and redness the day it was taken out.  She had this in the past as well, thinks it is due to synthetic hair used. Started using  the T gel as recommended last time this happened, mild relief  There are  not blistered, not bruised, not burning, not draining, not excoriated, notpainful, not scaling, not swollen and not weeping.  There has be no animal contact, no chemical exposure, no diapers, no exposure to similar rash, no food, no insect bite/sting, no medications, no new detergent/soap, no nuts, no plant contact, no pollen, no sick contacts and no sun exposure. Associated symptoms: no abdominal pain, no fatigue, no fever, no headaches, no hoarse voice, no joint pain, no myalgias, no nausea, no periorbital edema, no shortness of breath, no sore throat, no throat swelling, no tongue swelling, noURI, not vomiting and not wheezing.   Past Medical, Surgical, Social History, Allergies, and Medications have been Reviewed.  No past medical history on file.  No outpatient medications have  been marked as taking for the 11/21/20 encounter (Video Visit) with [facility] PROVIDER.     Allergies:   Penicillins   ROS See HPI for history of present illness.  Physical Exam Constitutional:      Appearance: Normal appearance.  HENT:     Head: Normocephalic.     Comments: Mild redness along hair line seen     Nose: Nose normal.  Eyes:     Conjunctiva/sclera: Conjunctivae normal.  Neurological:     Mental Status: She is alert.              A&P  1. Seborrheic dermatitis of scalp -encouraged to not use synthetic hair  items -pred and T gel recommended. -  Reviewed side effects, risks and benefits of medication.   Patient acknowledged agreement and understanding of the plan.     - predniSONE (DELTASONE) 10 MG tablet; Take 4 tablets (40 mg total) by mouth daily with breakfast for 2 days, THEN 3 tablets (30 mg total) daily with breakfast for 2 days, THEN 2 tablets (20 mg total) daily with breakfast for 2 days, THEN 1 tablet (10 mg total) daily with breakfast for 2 days.  Dispense: 20 tablet; Refill: 0   Time:   Today, I have spent 10 minutes with the patient with telehealth technology discussing the above problems, reviewing the chart, previous notes, medications and orders.   Medication Changes: No orders of the defined types were placed in this encounter.    Disposition:  Follow up as needed Signed, Katherine Finner, NP  11/21/2020 5:28 PM

## 2020-11-21 NOTE — Patient Instructions (Signed)
Avoid synthetic hair items in future. You might have sensitivities to the dyes or products used in making them.  Take Prednisone as ordered and continue use of T Gel.  Hope this helps you feel better soon   Dahlia Client

## 2020-12-06 IMAGING — CR DG CHEST 2V
2 series · 2 of 2 positions shown · non-contrast
Comparison: 04/16/2018.

CLINICAL DATA: Chest pain.

EXAM:
CHEST - 2 VIEW

[chest pa]
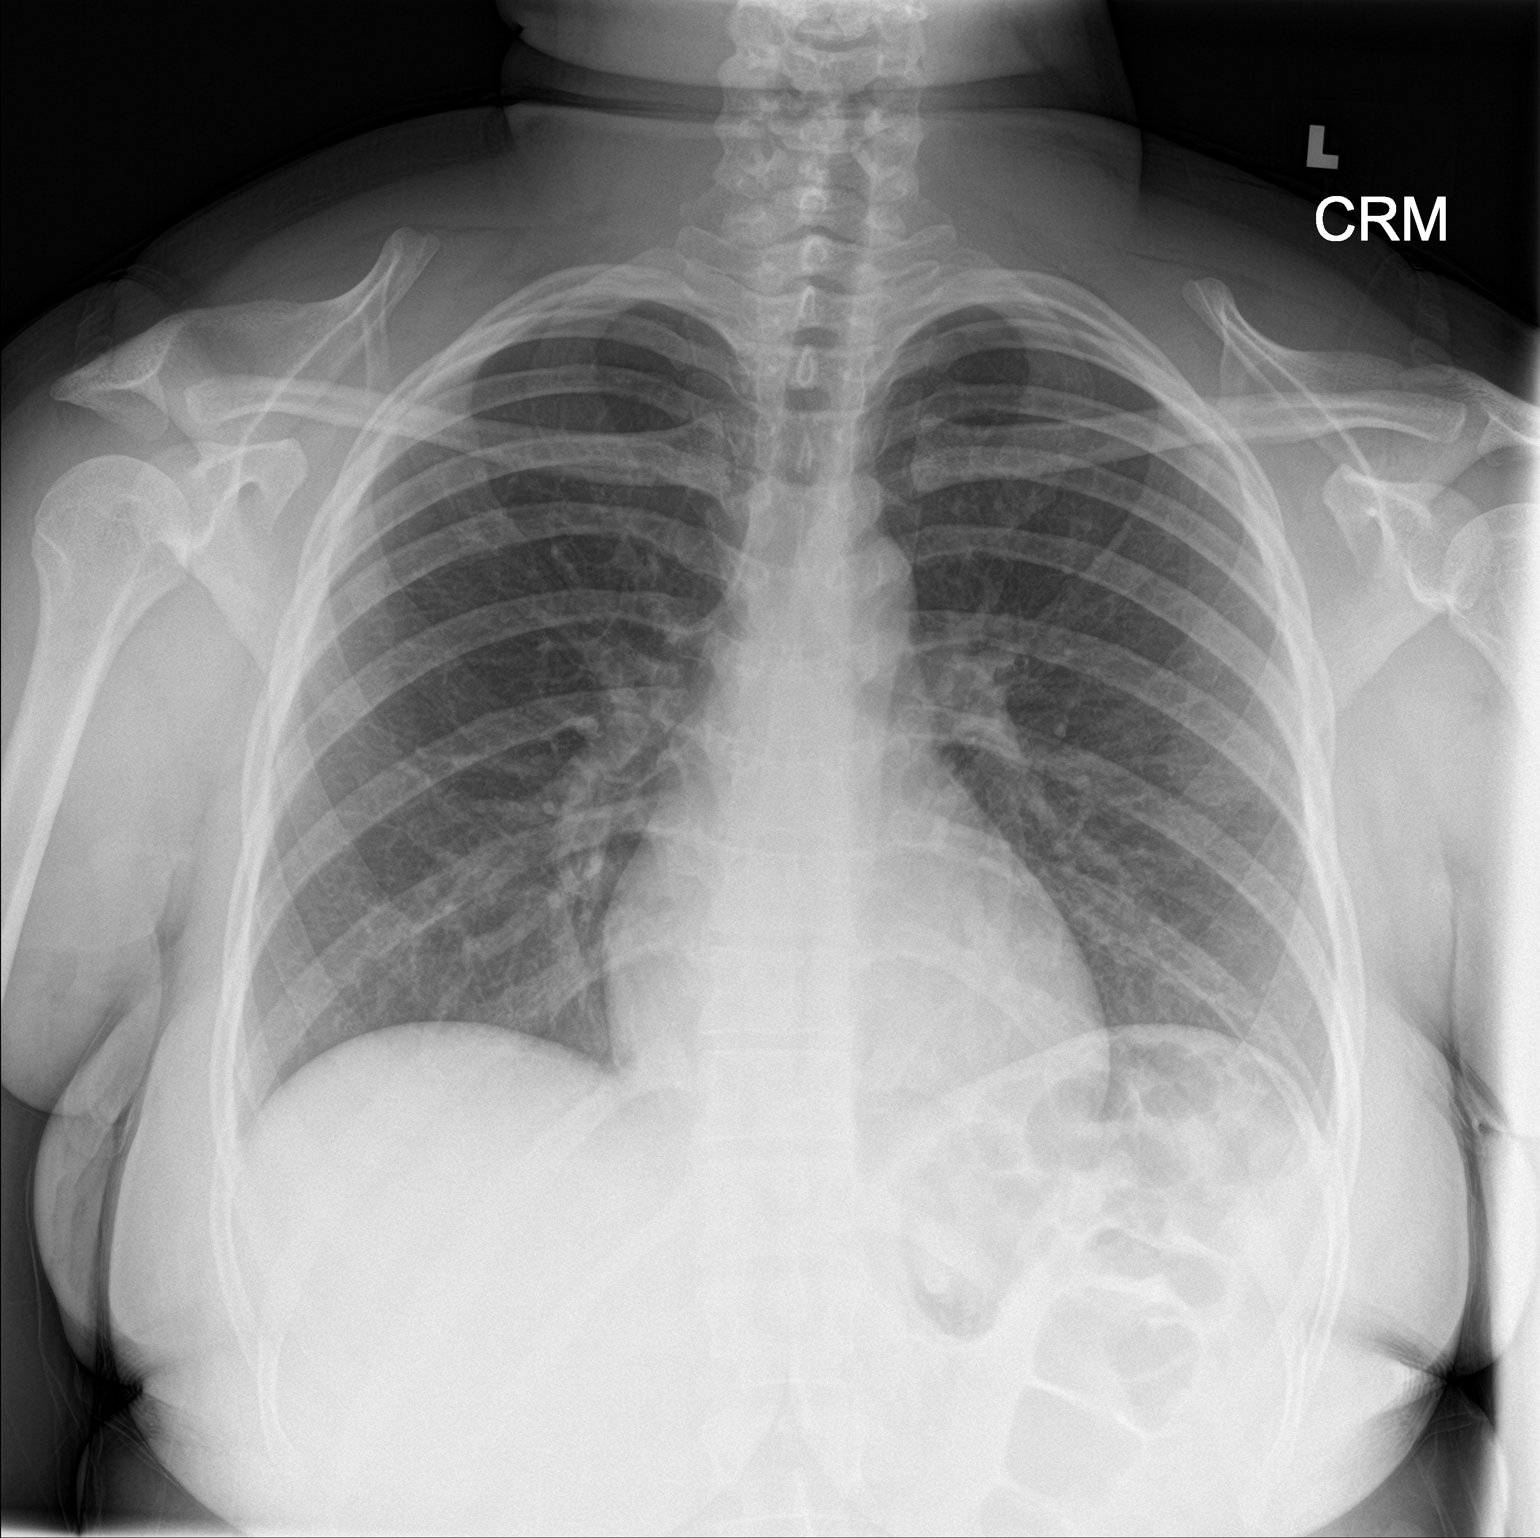

[chest lat]
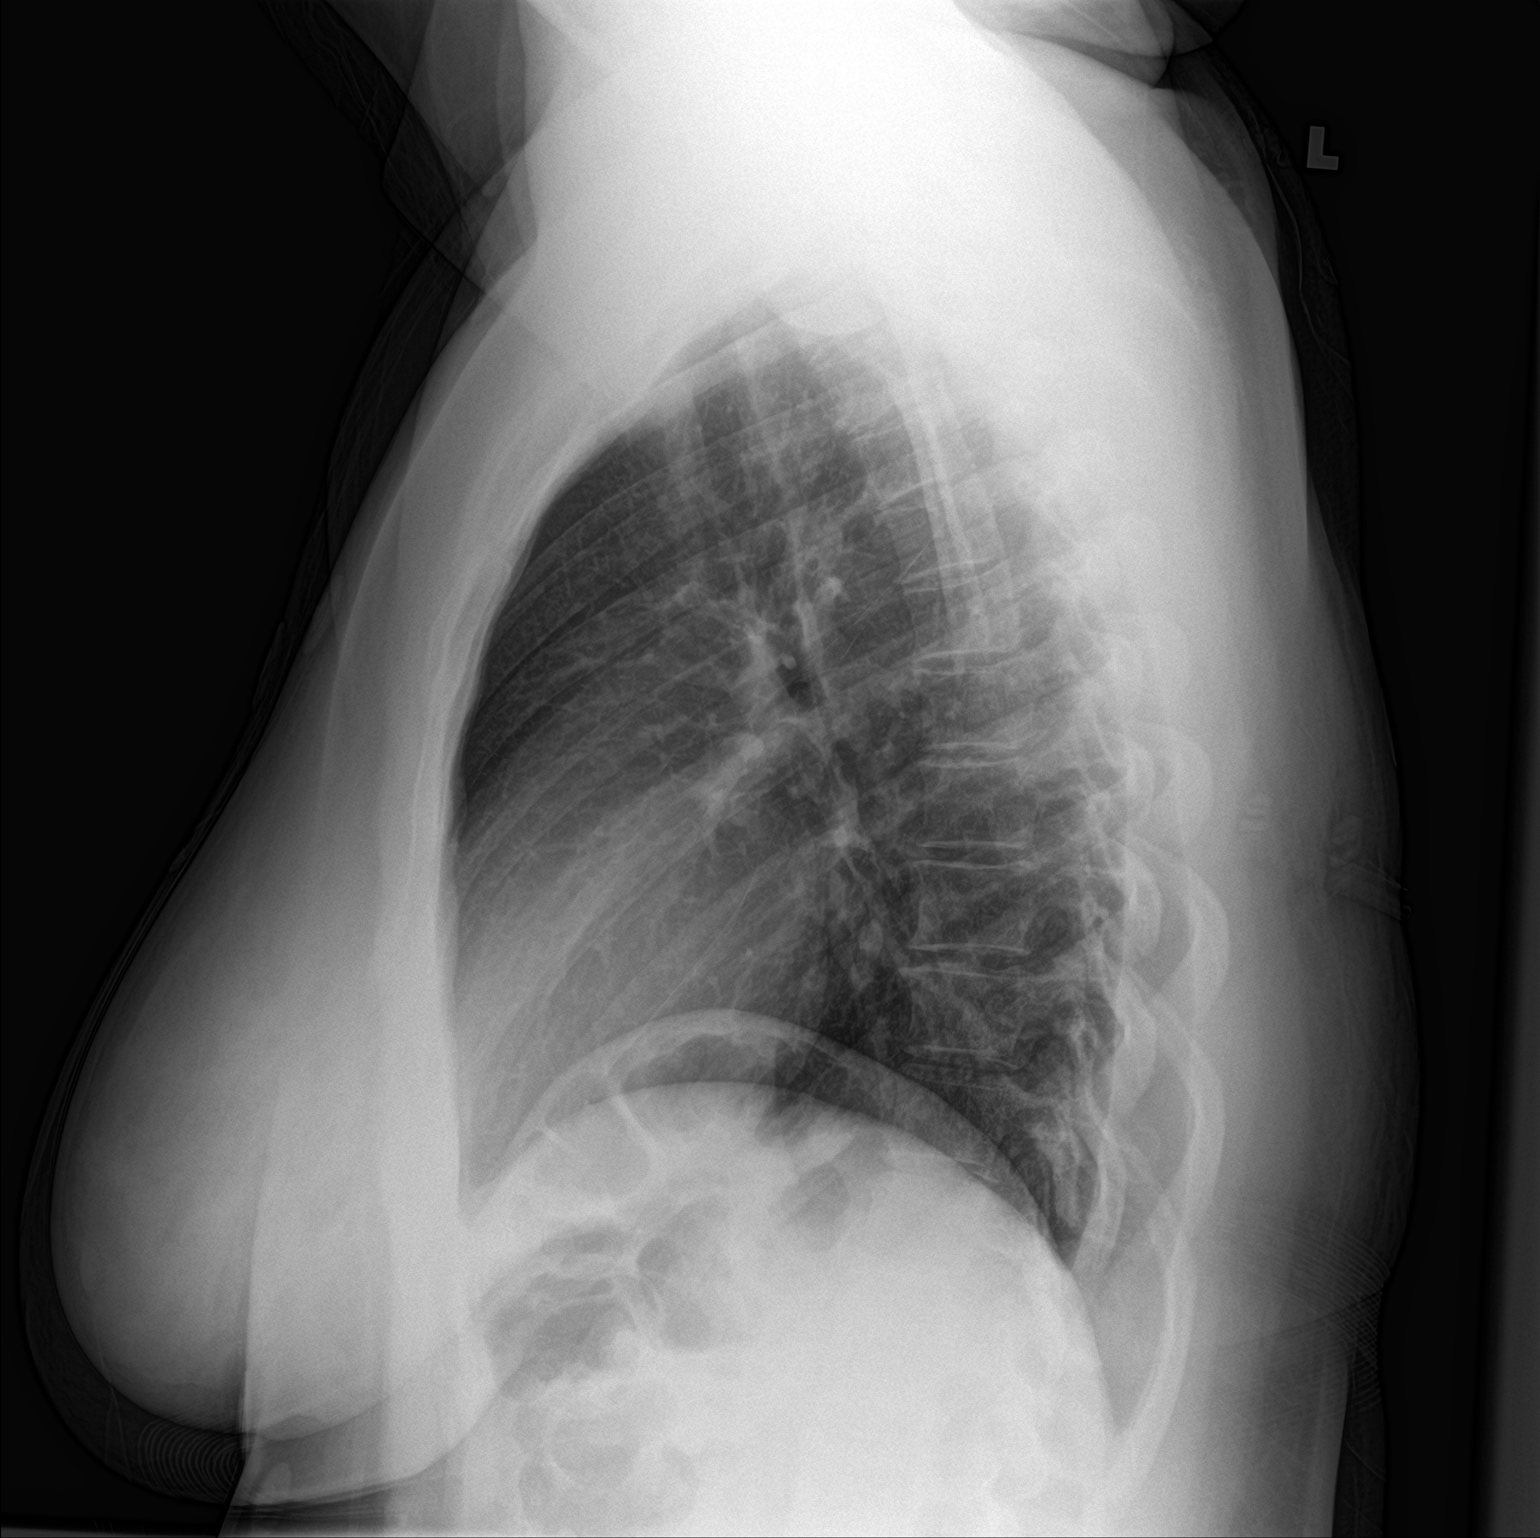

[2 of 2 positions shown; findings below may reference images not displayed]

FINDINGS: Mediastinum hilar structures normal. Lungs are clear. No pleural
effusion or pneumothorax. Heart size normal. No acute bony
abnormality.
IMPRESSION: No acute cardiopulmonary disease.

## 2020-12-18 ENCOUNTER — Ambulatory Visit: Payer: BC Managed Care – PPO | Admitting: Family Medicine

## 2020-12-18 ENCOUNTER — Encounter: Payer: Self-pay | Admitting: Family Medicine

## 2020-12-18 VITALS — BP 120/86 | HR 77 | Ht 66.0 in | Wt 190.6 lb

## 2020-12-18 DIAGNOSIS — Z789 Other specified health status: Secondary | ICD-10-CM

## 2020-12-18 NOTE — Progress Notes (Signed)
Subjective:     Patient ID: Katherine Dunlap, female   DOB: May 14, 1995, 25 y.o.   MRN: [id number]  HPI Katherine Dunlap [facility] the [facility] today for her required wellness visit for her insurance. Her PCP is Dr. Dessie Coma. She states she had a physical with pap smear done on 10/30/20. She has had 2 covid-19 vaccines, no booster. She reports concern for anemia, states her mom has anemia, states she often feels tired and cold, chews on ice, has heavy menstrual cycles.   No past medical history on file. Allergies  Allergen Reactions   Penicillins Hives and Itching    Did it involve swelling of the face/tongue/throat, SOB, or low BP? No Did it involve sudden or severe rash/hives, skin peeling, or any reaction on the inside of your mouth or nose? Yes Did you need to seek medical attention at a hospital or doctor's office? Yes When did it last happen?      April 2020 If all above answers are "NO", may proceed with cephalosporin use.     Current Outpatient Medications:    BLISOVI 24 FE 1-20 MG-MCG(24) tablet, Take 1 tablet by mouth daily., Disp: , Rfl:    fluticasone (FLONASE) 50 MCG/ACT nasal spray, Place 2 sprays into both nostrils daily., Disp: 16 g, Rfl: 6   gabapentin (NEURONTIN) 300 MG capsule, Take 300 mg by mouth 3 (three) times daily., Disp: , Rfl:    loratadine (CLARITIN) 10 MG tablet, Take 10 mg by mouth daily., Disp: , Rfl:    Review of Systems  Constitutional:  Positive for fatigue. Negative for chills, fever and unexpected weight change.  HENT:  Negative for congestion, ear pain, sinus pressure, sinus pain and sore throat.   Eyes:  Negative for discharge and visual disturbance.  Respiratory:  Negative for cough, shortness of breath and wheezing.   Cardiovascular:  Negative for chest pain and leg swelling.  Gastrointestinal:  Negative for abdominal pain, blood in stool, constipation, diarrhea, nausea and vomiting.  Genitourinary:  Negative for difficulty  urinating and hematuria.  Skin:  Negative for color change.  Neurological:  Negative for dizziness, weakness, light-headedness and headaches.  Hematological:  Negative for adenopathy.  All other systems reviewed and are negative.     Objective:   Physical Exam Vitals reviewed.  Constitutional:      General: She is not in acute distress.    Appearance: Normal appearance. She is well-developed.  HENT:     Head: Normocephalic and atraumatic.  Eyes:     General:        Right eye: No discharge.        Left eye: No discharge.  Cardiovascular:     Rate and Rhythm: Normal rate and regular rhythm.     Heart sounds: Normal heart sounds.  Pulmonary:     Effort: Pulmonary effort is normal. No respiratory distress.     Breath sounds: Normal breath sounds.  Musculoskeletal:     Cervical back: Neck supple.  Skin:    General: Skin is warm and dry.  Neurological:     Mental Status: She is alert and oriented to person, place, and time.  Psychiatric:        Mood and Affect: Mood normal.        Behavior: Behavior normal.  Today's Vitals   12/18/20 1558  BP: 120/86  Pulse: 77  SpO2: 98%  Weight: 190 lb 9.6 oz (86.5 kg)  Height: 5\' 6"  (1.676 m)  Body mass index is 30.76 kg/m.      Assessment:     Participant in health and wellness plan     Plan:     Keep all scheduled appts with PCP. Will get fasting blood work at next appt in 2 weeks, check lipids, cbc, and cmp. Encouraged efforts at healthy diet and exercise. F/u here prn.

## 2020-12-21 ENCOUNTER — Telehealth: Payer: BC Managed Care – PPO | Admitting: Physician Assistant

## 2020-12-21 DIAGNOSIS — L219 Seborrheic dermatitis, unspecified: Secondary | ICD-10-CM

## 2020-12-21 MED ORDER — FLUOCINOLONE ACETONIDE 0.01 % EX SOLN: Freq: Two times a day (BID) | CUTANEOUS | 0 refills | Status: DC

## 2020-12-21 NOTE — Patient Instructions (Signed)
[name], thank you for joining [name], PA-C for today's virtual visit.  While this provider is not your primary care provider (PCP), if your PCP is located in our provider database this encounter information will be shared with them immediately following your visit.  Consent: (Patient) Katherine Dunlap provided verbal consent for this virtual visit at the beginning of the encounter.  Current Medications:  Current Outpatient Medications:    fluocinolone (SYNALAR) 0.01 % external solution, Apply topically 2 (two) times daily., Disp: 180 mL, Rfl: 0   BLISOVI 24 FE 1-20 MG-MCG(24) tablet, Take 1 tablet by mouth daily., Disp: , Rfl:    fluticasone (FLONASE) 50 MCG/ACT nasal spray, Place 2 sprays into both nostrils daily., Disp: 16 g, Rfl: 6   gabapentin (NEURONTIN) 300 MG capsule, Take 300 mg by mouth 3 (three) times daily., Disp: , Rfl:    loratadine (CLARITIN) 10 MG tablet, Take 10 mg by mouth daily., Disp: , Rfl:    Medications ordered in this encounter:  Meds ordered this encounter  Medications   fluocinolone (SYNALAR) 0.01 % external solution    Sig: Apply topically 2 (two) times daily.    Dispense:  180 mL    Refill:  0    Order Specific Question:   Supervising Provider    Answer:   Hyacinth Meeker, BRIAN [[id number]]     *If you need refills on other medications prior to your next appointment, please contact your pharmacy*  Follow-Up: Call back or seek an in-person evaluation if the symptoms worsen or if the condition fails to improve as anticipated.  Other Instructions  - Suspect recurrence of seborrheic dermatitis secondary to box hair dye application - Advised could continue T gel or change to selsun blue (cost) - Use one of the above shampoos 2-3 times weekly with conditioner of choice - During flares start using fluocinolone as noted above until cleared; repeat as necessary - Follow up with dermatology for further evaluation and long term treatment options  Seborrheic  Dermatitis, Adult Seborrheic dermatitis is a skin disease that causes red, scaly patches. It usually occurs on the scalp, and it is often called dandruff. The patches may appear on other parts of the body. Skin patches tend to appear where there are many oil glands in the skin. Areas of the body that are commonly affected include the: Scalp. Ears. Eyebrows. Face. Bearded area of [employer]. Skin folds of the body, such as the armpits, groin, and buttocks. Chest. The condition may come and go for no known reason, and it is often long-lasting (chronic). What are the causes? The cause of this condition is not known. What increases the risk? The following factors may make you more likely to develop this condition: Having certain conditions, such as: HIV (human immunodeficiency virus). AIDS (acquired immunodeficiency syndrome). Parkinson's disease. Mood disorders, such as depression. Being 10-60 years old. What are the signs or symptoms? Symptoms of this condition include: Thick scales on the scalp. Redness on the face or in the armpits. Skin that is flaky. The flakes may be white or yellow. Skin that seems oily or dry but is not helped with moisturizers. Itching or burning in the affected areas. How is this diagnosed? This condition is diagnosed with a medical history and physical exam. A sample of your skin may be tested (skin biopsy). You may need to see a skin specialist (dermatologist). How is this treated? There is no cure for this condition, but treatment can help to manage the symptoms.  You may get treatment to remove scales, lower the risk of skin infection, and reduce swelling or itching. Treatment may include: Creams that reduce skin yeast. Medicated shampoo. Moisturizing creams or ointments. Creams that reduce swelling and irritation (steroids). Follow these instructions at home: Apply over-the-counter and prescription medicines only as told by your health care  provider. Use any medicated shampoo, skin creams, or ointments only as told by your health care provider. Keep all follow-up visits as told by your health care provider. This is important. Contact a health care provider if: Your symptoms do not improve with treatment. Your symptoms get worse. You have new symptoms. Get help right away if: Your condition rapidly worsens with treatment. Summary Seborrheic dermatitis is a skin disease that causes red, scaly patches. Seborrheic dermatitis commonly affects the scalp, face, and skin folds. There is no cure for this condition, but treatment can help to manage the symptoms. This information is not intended to replace advice given to you by your health care provider. Make sure you discuss any questions you have with your healthcare provider. Document Revised: 01/27/2019 Document Reviewed: 01/27/2019 Elsevier Patient Education  2022 [employer].   If you have been instructed to have an in-person evaluation today at a local Urgent Care facility, please use the link below. It will take you to a list of all of our available [facility], including address, phone number and hours of operation. Please do not delay care.  [facility]  If you or a family member do not have a primary care provider, use the link below to schedule a visit and establish care. When you choose a [facility] primary care physician or advanced practice provider, you gain a long-term partner in health. Find a Primary Care Provider  Learn more about [facility]'s in-office and virtual care options: [facility] - Get Care Now

## 2020-12-21 NOTE — Progress Notes (Signed)
Virtual Visit Consent   Katherine Dunlap, you are scheduled for a virtual visit with a [facility] provider today.     Just as with appointments in the office, your consent must be obtained to participate.  Your consent will be active for this visit and any virtual visit you may have with one of our providers in the next 365 days.     If you have a MyChart account, a copy of this consent can be sent to you electronically.  All virtual visits are billed to your insurance company just like a traditional visit in the office.    As this is a virtual visit, video technology does not allow for your provider to perform a traditional examination.  This may limit your provider's ability to fully assess your condition.  If your provider identifies any concerns that need to be evaluated in person or the need to arrange testing (such as labs, EKG, etc.), we will make arrangements to do so.     Although advances in technology are sophisticated, we cannot ensure that it will always work on either your end or our end.  If the connection with a video visit is poor, the visit may have to be switched to a telephone visit.  With either a video or telephone visit, we are not always able to ensure that we have a secure connection.     I need to obtain your verbal consent now.   Are you willing to proceed with your visit today?    Katherine Dunlap has provided verbal consent on 12/21/2020 for a virtual visit (video or telephone).   [name], PA-C   Date: 12/21/2020 1:59 PM   Virtual Visit via Video Note   I, [name], connected with  Katherine Dunlap  ([id number], 1995-11-23) on 12/21/20 at  1:45 PM EDT by a video-enabled telemedicine application and verified that I am speaking with the correct person using two identifiers.  Location: Patient: Virtual Visit Location Patient: Home Provider: Virtual Visit Location Provider: Home Office   I discussed the limitations of evaluation and management by telemedicine  and the availability of in person appointments. The patient expressed understanding and agreed to proceed.    History of Present Illness: Katherine Dunlap is a 25 y.o. who identifies as a female who was assigned female at birth, and is being seen today for seborrheic dermatitis of the scalp. Last seen for this issue on 11/21/20. At that time was caused by synthetic hair products. This time patient used box hair coloring on Monday and had thought things were ok, but this morning woke up with her scalp feeling like it was on fire and itching significantly. She has been using T gel intermittently and feels that had been helping until this most recent episode.  Problems:  Patient Active Problem List   Diagnosis Date Noted   Severe depression (HCC) 05/11/2018   Migraine with aura and without status migrainosus, not intractable 07/24/2016   Environmental allergies 12/07/2015    Allergies:  Allergies  Allergen Reactions   Penicillins Hives and Itching    Did it involve swelling of the face/tongue/throat, SOB, or low BP? No Did it involve sudden or severe rash/hives, skin peeling, or any reaction on the inside of your mouth or nose? Yes Did you need to seek medical attention at a hospital or doctor's office? Yes When did it last happen?      April 2020 If all above answers are "NO", may proceed with  cephalosporin use.    Medications:  Current Outpatient Medications:    fluocinolone (SYNALAR) 0.01 % external solution, Apply topically 2 (two) times daily., Disp: 180 mL, Rfl: 0   BLISOVI 24 FE 1-20 MG-MCG(24) tablet, Take 1 tablet by mouth daily., Disp: , Rfl:    fluticasone (FLONASE) 50 MCG/ACT nasal spray, Place 2 sprays into both nostrils daily., Disp: 16 g, Rfl: 6   gabapentin (NEURONTIN) 300 MG capsule, Take 300 mg by mouth 3 (three) times daily., Disp: , Rfl:    loratadine (CLARITIN) 10 MG tablet, Take 10 mg by mouth daily., Disp: , Rfl:   Observations/Objective: Patient is well-developed,  well-nourished in no acute distress.  Resting comfortably at home.  Head is normocephalic, atraumatic.  No labored breathing.  Speech is clear and coherent with logical content.  Patient is alert and oriented at baseline.    Assessment and Plan: 1. Seborrheic dermatitis of scalp - fluocinolone (SYNALAR) 0.01 % external solution; Apply topically 2 (two) times daily.  Dispense: 180 mL; Refill: 0  - Suspect recurrence of seborrheic dermatitis secondary to box hair dye application - Advised could continue T gel or change to selsun blue (cost) - Use one of the above shampoos 2-3 times weekly with conditioner of choice - During flares start using fluocinolone as noted above until cleared; repeat as necessary - Follow up with dermatology for further evaluation and long term treatment options  Follow Up Instructions: I discussed the assessment and treatment plan with the patient. The patient was provided an opportunity to ask questions and all were answered. The patient agreed with the plan and demonstrated an understanding of the instructions.  A copy of instructions were sent to the patient via MyChart.  The patient was advised to call back or seek an in-person evaluation if the symptoms worsen or if the condition fails to improve as anticipated.  Time:  I spent 14 minutes with the patient via telehealth technology discussing the above problems/concerns.    [name], PA-C

## 2020-12-22 ENCOUNTER — Encounter: Payer: Self-pay | Admitting: Physician Assistant

## 2020-12-22 ENCOUNTER — Telehealth: Payer: BC Managed Care – PPO | Admitting: Nurse Practitioner

## 2020-12-22 DIAGNOSIS — L242 Irritant contact dermatitis due to solvents: Secondary | ICD-10-CM

## 2020-12-22 MED ORDER — PREDNISONE 20 MG PO TABS: 20.0000 mg | ORAL_TABLET | Freq: Two times a day (BID) | ORAL | 0 refills | Status: AC

## 2020-12-22 NOTE — Progress Notes (Signed)
Virtual Visit Consent   Katherine Dunlap, you are scheduled for a virtual visit with a [facility] provider today.     Just as with appointments in the office, your consent must be obtained to participate.  Your consent will be active for this visit and any virtual visit you may have with one of our providers in the next 365 days.     If you have a MyChart account, a copy of this consent can be sent to you electronically.  All virtual visits are billed to your insurance company just like a traditional visit in the office.    As this is a virtual visit, video technology does not allow for your provider to perform a traditional examination.  This may limit your provider's ability to fully assess your condition.  If your provider identifies any concerns that need to be evaluated in person or the need to arrange testing (such as labs, EKG, etc.), we will make arrangements to do so.     Although advances in technology are sophisticated, we cannot ensure that it will always work on either your end or our end.  If the connection with a video visit is poor, the visit may have to be switched to a telephone visit.  With either a video or telephone visit, we are not always able to ensure that we have a secure connection.     I need to obtain your verbal consent now.   Are you willing to proceed with your visit today?    Katherine Dunlap has provided verbal consent on 12/22/2020 for a virtual visit (video or telephone).   [name], FNP   Date: 12/22/2020 11:09 AM   Virtual Visit via Video Note   I, [name], connected with  Katherine Dunlap  ([id number], [birth year]) on 12/22/20 at 11:15 AM EDT by a video-enabled telemedicine application and verified that I am speaking with the correct person using two identifiers.  Location: Patient: Virtual Visit Location Patient: Home Provider: Virtual Visit Location Provider: Office/Clinic   I discussed the limitations of evaluation and management by telemedicine and the  availability of in person appointments. The patient expressed understanding and agreed to proceed.    History of Present Illness: Katherine Dunlap is a 25 y.o. who identifies as a female who was assigned female at birth, and is being seen today for ongoing scalp irritation.   This started after she used a hair dye while she had a weave in. She has used the dye in the past- she thought initially it was a reaction to the weave so she removed that. Then started using T gel without relief. She was prescribed Synalar solution yesterday and after using that she had intense burning in her scalp.    Problems:  Patient Active Problem List   Diagnosis Date Noted   Severe depression (HCC) 05/11/2018   Migraine with aura and without status migrainosus, not intractable 07/24/2016   Environmental allergies 12/07/2015    Allergies:  Allergies  Allergen Reactions   Penicillins Hives and Itching    Did it involve swelling of the face/tongue/throat, SOB, or low BP? No Did it involve sudden or severe rash/hives, skin peeling, or any reaction on the inside of your mouth or nose? Yes Did you need to seek medical attention at a hospital or doctor's office? Yes When did it last happen?      April 2020 If all above answers are "NO", may proceed with cephalosporin use.    Medications:  Current  Outpatient Medications:    BLISOVI 24 FE 1-20 MG-MCG(24) tablet, Take 1 tablet by mouth daily., Disp: , Rfl:    fluocinolone (SYNALAR) 0.01 % external solution, Apply topically 2 (two) times daily., Disp: 180 mL, Rfl: 0   fluticasone (FLONASE) 50 MCG/ACT nasal spray, Place 2 sprays into both nostrils daily., Disp: 16 g, Rfl: 6   gabapentin (NEURONTIN) 300 MG capsule, Take 300 mg by mouth 3 (three) times daily., Disp: , Rfl:    loratadine (CLARITIN) 10 MG tablet, Take 10 mg by mouth daily., Disp: , Rfl:   Observations/Objective: Patient is well-developed, well-nourished in no acute distress.  Resting comfortably at home.   Head is normocephalic, atraumatic.  No labored breathing.  Speech is clear and coherent with logical content.  Patient is alert and oriented at baseline.    Assessment and Plan: 1. Irritant contact dermatitis due to solvent Stop all topical treatments at this time. Allow scalp to rest, luke warm rinses throughout the day. Avoid any product in hair including sprays.   After 48 hours of rest if symptoms have resolved may restart T-gel if tolerated.   Likely chemical reaction from multiple products used back to back on scalp.   Will start low dose daily prednisone to help calm reaction and patient will continue to take benadryl daily for help as well.   Meds ordered this encounter  Medications   predniSONE (DELTASONE) 20 MG tablet    Sig: Take 1 tablet (20 mg total) by mouth 2 (two) times daily with a meal for 5 days. Take with food    Dispense:  10 tablet    Refill:  0     If no improvement by end of weekend recommend dermatology evaluation, seek earlier follow up for worsening symptoms as discussed.   Avoid using hair dye in the future - review ingredients to make aware of what allergens or sensitivities patient may have as well       Follow Up Instructions: I discussed the assessment and treatment plan with the patient. The patient was provided an opportunity to ask questions and all were answered. The patient agreed with the plan and demonstrated an understanding of the instructions.  A copy of instructions were sent to the patient via MyChart.  The patient was advised to call back or seek an in-person evaluation if the symptoms worsen or if the condition fails to improve as anticipated.  Time:  I spent 15 minutes with the patient via telehealth technology discussing the above problems/concerns.    [name], FNP

## 2021-01-01 ENCOUNTER — Other Ambulatory Visit: Payer: Self-pay | Admitting: Family Medicine

## 2021-01-02 LAB — CBC
HCT: 38.2 % (ref 35.0–45.0)
Hemoglobin: 11.6 g/dL — ABNORMAL LOW (ref 11.7–15.5)
MCH: 26.9 pg — ABNORMAL LOW (ref 27.0–33.0)
MCHC: 30.4 g/dL — ABNORMAL LOW (ref 32.0–36.0)
MCV: 88.4 fL (ref 80.0–100.0)
MPV: 10.8 fL (ref 7.5–12.5)
Platelets: 283 10*3/uL (ref 140–400)
RBC: 4.32 10*6/uL (ref 3.80–5.10)
RDW: 14 % (ref 11.0–15.0)
WBC: 3.6 10*3/uL — ABNORMAL LOW (ref 3.8–10.8)

## 2021-01-02 LAB — COMPLETE METABOLIC PANEL WITH GFR
AG Ratio: 1.4 (calc) (ref 1.0–2.5)
ALT: 11 U/L (ref 6–29)
AST: 15 U/L (ref 10–30)
Albumin: 4.1 g/dL (ref 3.6–5.1)
Alkaline phosphatase (APISO): 40 U/L (ref 31–125)
BUN: 11 mg/dL (ref 7–25)
CO2: 21 mmol/L (ref 20–32)
Calcium: 9.3 mg/dL (ref 8.6–10.2)
Chloride: 105 mmol/L (ref 98–110)
Creat: 0.62 mg/dL (ref 0.50–0.96)
Globulin: 3 g/dL (calc) (ref 1.9–3.7)
Glucose, Bld: 85 mg/dL (ref 65–99)
Potassium: 4.2 mmol/L (ref 3.5–5.3)
Sodium: 135 mmol/L (ref 135–146)
Total Bilirubin: 0.3 mg/dL (ref 0.2–1.2)
Total Protein: 7.1 g/dL (ref 6.1–8.1)
eGFR: 127 mL/min/{1.73_m2} (ref >=60)

## 2021-01-02 LAB — LIPID PANEL
Cholesterol: 188 mg/dL (ref <200)
HDL: 61 mg/dL (ref >=50)
LDL Cholesterol (Calc): 112 mg/dL (calc) — ABNORMAL HIGH
Non-HDL Cholesterol (Calc): 127 mg/dL (calc) (ref <130)
Total CHOL/HDL Ratio: 3.1 (calc) (ref <5.0)
Triglycerides: 61 mg/dL (ref <150)

## 2021-01-08 ENCOUNTER — Telehealth: Payer: Self-pay | Admitting: Physician Assistant

## 2021-01-08 DIAGNOSIS — L219 Seborrheic dermatitis, unspecified: Secondary | ICD-10-CM

## 2021-01-08 DIAGNOSIS — M792 Neuralgia and neuritis, unspecified: Secondary | ICD-10-CM

## 2021-01-08 MED ORDER — METHYLPREDNISOLONE 4 MG PO TBPK: ORAL_TABLET | ORAL | 0 refills | Status: DC

## 2021-01-08 NOTE — Patient Instructions (Signed)
  Katherine Dunlap, thank you for joining Piedad Climes, PA-C for today's virtual visit.  While this provider is not your primary care provider (PCP), if your PCP is located in our provider database this encounter information will be shared with them immediately following your visit.  Consent: (Patient) Katherine Dunlap provided verbal consent for this virtual visit at the beginning of the encounter.  Current Medications:  Current Outpatient Medications:    methylPREDNISolone (MEDROL DOSEPAK) 4 MG TBPK tablet, Take following package directions, Disp: 21 tablet, Rfl: 0   BLISOVI 24 FE 1-20 MG-MCG(24) tablet, Take 1 tablet by mouth daily., Disp: , Rfl:    fluticasone (FLONASE) 50 MCG/ACT nasal spray, Place 2 sprays into both nostrils daily., Disp: 16 g, Rfl: 6   gabapentin (NEURONTIN) 300 MG capsule, Take 300 mg by mouth 3 (three) times daily., Disp: , Rfl:    loratadine (CLARITIN) 10 MG tablet, Take 10 mg by mouth daily., Disp: , Rfl:    Medications ordered in this encounter:  Meds ordered this encounter  Medications   methylPREDNISolone (MEDROL DOSEPAK) 4 MG TBPK tablet    Sig: Take following package directions    Dispense:  21 tablet    Refill:  0    Order Specific Question:   Supervising Provider    Answer:   Hyacinth Meeker, BRIAN [[id number]]     *If you need refills on other medications prior to your next appointment, please contact your pharmacy*  Follow-Up: Call back or seek an in-person evaluation if the symptoms worsen or if the condition fails to improve as anticipated.  Other Instructions Please continue care for the seborrhea as directed by your Dermatologist, using the Ketoconazole shampoo.  I think you have two separate issues going on as the seborrhea seems to be very mild/stable currently and there is no evidence of infection on examination today. 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 This may have been masked int he past by use of your  Gabapentin which you have not taken regularly recently. This is something you should restart as directed and schedule follow-up with your PCP for ongoing management. I have sent in a small dose of steroid to calm down acute inflammation but this is not something that we can continue to give via video visit as it is only helping with symptoms and not fixing an underlying problem.    If you have been instructed to have an in-person evaluation today at a local Urgent Care facility, please use the link below. It will take you to a list of all of our available [facility], including address, phone number and hours of operation. Please do not delay care.  [facility]  If you or a family member do not have a primary care provider, use the link below to schedule a visit and establish care. When you choose a [facility] primary care physician or advanced practice provider, you gain a long-term partner in health. Find a Primary Care Provider  Learn more about [facility] in-office and virtual care options: [facility] - Get Care Now

## 2021-01-08 NOTE — Progress Notes (Signed)
Virtual Visit Consent   Katherine Dunlap, you are scheduled for a virtual visit with a [facility] provider today.     Just as with appointments in the office, your consent must be obtained to participate.  Your consent will be active for this visit and any virtual visit you may have with one of our providers in the next 365 days.     If you have a MyChart account, a copy of this consent can be sent to you electronically.  All virtual visits are billed to your insurance company just like a traditional visit in the office.    As this is a virtual visit, video technology does not allow for your provider to perform a traditional examination.  This may limit your provider's ability to fully assess your condition.  If your provider identifies any concerns that need to be evaluated in person or the need to arrange testing (such as labs, EKG, etc.), we will make arrangements to do so.     Although advances in technology are sophisticated, we cannot ensure that it will always work on either your end or our end.  If the connection with a video visit is poor, the visit may have to be switched to a telephone visit.  With either a video or telephone visit, we are not always able to ensure that we have a secure connection.     I need to obtain your verbal consent now.   Are you willing to proceed with your visit today?    Katherine Dunlap has provided verbal consent on 01/08/2021 for a virtual visit (video or telephone).   Piedad Climes, [place]   Date: 01/08/2021 12:29 PM   Virtual Visit via Video Note   I, Piedad Climes, connected with  Katherine Dunlap  ([id number], [birth year]) on 01/08/21 at 12:15 PM EDT by a video-enabled telemedicine application and verified that I am speaking with the correct person using two identifiers.  Location: Patient: Virtual Visit Location Patient: Home Provider: Virtual Visit Location Provider: Home Office   I discussed the limitations of evaluation and management by telemedicine and  the availability of in person appointments. The patient expressed understanding and agreed to proceed.    History of Present Illness: Katherine Dunlap is a 25 y.o. who identifies as a female who was assigned female at birth, and is being seen today for recurring redness and burning pain of left frontal scalp. This has flared up over the past couple of days without any known rash. Previously seen for seborrhea in this area and has been seen and treated by PCP and most recently (Last Friday) Dermatology. Was prescribed Ketoconazole but has not started this.   HPI: HPI  Problems:  Patient Active Problem List   Diagnosis Date Noted   Severe depression (HCC) 05/11/2018   Migraine with aura and without status migrainosus, not intractable 07/24/2016   Environmental allergies 12/07/2015    Allergies:  Allergies  Allergen Reactions   Penicillins Hives and Itching    Did it involve swelling of the face/tongue/throat, SOB, or low BP? No Did it involve sudden or severe rash/hives, skin peeling, or any reaction on the inside of your mouth or nose? Yes Did you need to seek medical attention at a hospital or doctor's office? Yes When did it last happen?      April 2020 If all above answers are "NO", may proceed with cephalosporin use.    Medications:  Current Outpatient Medications:    methylPREDNISolone (MEDROL DOSEPAK)  4 MG TBPK tablet, Take following package directions, Disp: 21 tablet, Rfl: 0   BLISOVI 24 FE 1-20 MG-MCG(24) tablet, Take 1 tablet by mouth daily., Disp: , Rfl:    fluticasone (FLONASE) 50 MCG/ACT nasal spray, Place 2 sprays into both nostrils daily., Disp: 16 g, Rfl: 6   gabapentin (NEURONTIN) 300 MG capsule, Take 300 mg by mouth 3 (three) times daily., Disp: , Rfl:    loratadine (CLARITIN) 10 MG tablet, Take 10 mg by mouth daily., Disp: , Rfl:   Observations/Objective: Patient is well-developed, well-nourished in no acute distress.  Resting comfortably at home.  Head is normocephalic,  atraumatic.  No labored breathing. Speech is clear and coherent with logical content.  Patient is alert and oriented at baseline.   Assessment and Plan: 1. Neuralgia - methylPREDNISolone (MEDROL DOSEPAK) 4 MG TBPK tablet; Take following package directions  Dispense: 21 tablet; Refill: 0 Rx Medrol pack to calm acute flare. She is to restart Gabapentin once daily (in the evening) for the next week, calling her PCP for follow-up and further evaluation.  2. Seborrheic dermatitis Continue management per Dermatologist.   Follow Up Instructions: I discussed the assessment and treatment plan with the patient. The patient was provided an opportunity to ask questions and all were answered. The patient agreed with the plan and demonstrated an understanding of the instructions.  A copy of instructions were sent to the patient via MyChart.  The patient was advised to call back or seek an in-person evaluation if the symptoms worsen or if the condition fails to improve as anticipated.  Time:  I spent 15 minutes with the patient via telehealth technology discussing the above problems/concerns.    Piedad Climes, PA-C

## 2021-02-12 ENCOUNTER — Telehealth: Payer: Self-pay | Admitting: Emergency Medicine

## 2021-02-12 DIAGNOSIS — J329 Chronic sinusitis, unspecified: Secondary | ICD-10-CM

## 2021-02-12 MED ORDER — FLUTICASONE PROPIONATE 50 MCG/ACT NA SUSP
2.0000 | Freq: Every day | NASAL | 0 refills | Status: DC
Start: 2021-02-12 — End: 2022-03-18

## 2021-02-12 MED ORDER — DOXYCYCLINE HYCLATE 100 MG PO CAPS: 100.0000 mg | ORAL_CAPSULE | Freq: Two times a day (BID) | ORAL | 0 refills | Status: DC

## 2021-02-12 NOTE — Patient Instructions (Signed)
   Based on what you have shared with me it looks like you have sinusitis.  Sinusitis is inflammation and infection in the sinus cavities of the head.  Based on your presentation I believe you most likely have Acute Bacterial Sinusitis.  This is an infection caused by bacteria and is treated with antibiotics. I have prescribed Doxycycline 100mg  by mouth twice a day for 10 days. You may use an oral decongestant such as Mucinex D or if you have glaucoma or high blood pressure use plain Mucinex. Saline nasal spray help and can safely be used as often as needed for congestion.  If you develop worsening sinus pain, fever or notice severe headache and vision changes, or if symptoms are not better after completion of antibiotic, please schedule an appointment with a health care provider.    Sinus infections are not as easily transmitted as other respiratory infection, however we still recommend that you avoid close contact with loved ones, especially the very young and elderly.  Remember to wash your hands thoroughly throughout the day as this is the number one way to prevent the spread of infection!  Home Care: Only take medications as instructed by your medical team. Complete the entire course of an antibiotic. Do not take these medications with alcohol. A steam or ultrasonic humidifier can help congestion.  You can place a towel over your head and breathe in the steam from hot water coming from a faucet. Avoid close contacts especially the very young and the elderly. Cover your mouth when you cough or sneeze. Always remember to wash your hands.  Get Help Right Away If: You develop worsening fever or sinus pain. You develop a severe head ache or visual changes. Your symptoms persist after you have completed your treatment plan.  Make sure you Understand these instructions. Will watch your condition. Will get help right away if you are not doing well or get worse.

## 2021-02-12 NOTE — Progress Notes (Signed)
Virtual Visit Consent   Katherine Dunlap, you are scheduled for a virtual visit with a [facility] provider today.     Just as with appointments in the office, your consent must be obtained to participate.  Your consent will be active for this visit and any virtual visit you may have with one of our providers in the next 365 days.     If you have a MyChart account, a copy of this consent can be sent to you electronically.  All virtual visits are billed to your insurance company just like a traditional visit in the office.    As this is a virtual visit, video technology does not allow for your provider to perform a traditional examination.  This may limit your provider's ability to fully assess your condition.  If your provider identifies any concerns that need to be evaluated in person or the need to arrange testing (such as labs, EKG, etc.), we will make arrangements to do so.     Although advances in technology are sophisticated, we cannot ensure that it will always work on either your end or our end.  If the connection with a video visit is poor, the visit may have to be switched to a telephone visit.  With either a video or telephone visit, we are not always able to ensure that we have a secure connection.     I need to obtain your verbal consent now.   Are you willing to proceed with your visit today?    [name] has provided verbal consent on 02/12/2021 for a virtual visit video.   Roxy Horseman, PA-C   Date: 02/12/2021 11:44 AM   Virtual Visit via Video Note   I, Roxy Horseman, connected with  Katherine Dunlap  ([id number], December 14, 1995) on 02/12/21 at 11:45 AM EDT by a video-enabled telemedicine application and verified that I am speaking with the correct person using two identifiers.  Location: Patient: Virtual Visit Location Patient: Home Provider: Virtual Visit Location Provider: Home Office   I discussed the limitations of evaluation and management by telemedicine and the availability  of in person appointments. The patient expressed understanding and agreed to proceed.    History of Present Illness: Katherine Dunlap is a 25 y.o. who identifies as a female who was assigned female at birth, and is being seen today for ear pain and congestion.  States that her symptoms started about a week ago.  States that she has tried taking benadryl and flonase.  She denies any fever.  States that it has worsened over the past few days. Denies pregnancy or breastfeeding.  HPI: HPI  Problems:  Patient Active Problem List   Diagnosis Date Noted   Severe depression (HCC) 05/11/2018   Migraine with aura and without status migrainosus, not intractable 07/24/2016   Environmental allergies 12/07/2015    Allergies:  Allergies  Allergen Reactions   Penicillins Hives and Itching    Did it involve swelling of the face/tongue/throat, SOB, or low BP? No Did it involve sudden or severe rash/hives, skin peeling, or any reaction on the inside of your mouth or nose? Yes Did you need to seek medical attention at a hospital or doctor's office? Yes When did it last happen?      April 2020 If all above answers are "NO", may proceed with cephalosporin use.    Medications:  Current Outpatient Medications:    BLISOVI 24 FE 1-20 MG-MCG(24) tablet, Take 1 tablet by mouth daily., Disp: , Rfl:  fluticasone (FLONASE) 50 MCG/ACT nasal spray, Place 2 sprays into both nostrils daily., Disp: 16 g, Rfl: 6   gabapentin (NEURONTIN) 300 MG capsule, Take 300 mg by mouth 3 (three) times daily., Disp: , Rfl:    loratadine (CLARITIN) 10 MG tablet, Take 10 mg by mouth daily., Disp: , Rfl:    methylPREDNISolone (MEDROL DOSEPAK) 4 MG TBPK tablet, Take following package directions, Disp: 21 tablet, Rfl: 0  Observations/Objective: Patient is well-developed, well-nourished in no acute distress.  Resting comfortably at home.  Head is normocephalic, atraumatic.  No labored breathing.  Speech is clear and coherent with logical  content.  Patient is alert and oriented at baseline.  Sounds congested  Assessment and Plan: 1. Sinusitis, unspecified chronicity, unspecified location Doxycycline Flonase  Follow Up Instructions: I discussed the assessment and treatment plan with the patient. The patient was provided an opportunity to ask questions and all were answered. The patient agreed with the plan and demonstrated an understanding of the instructions.  A copy of instructions were sent to the patient via MyChart unless otherwise noted below.     The patient was advised to call back or seek an in-person evaluation if the symptoms worsen or if the condition fails to improve as anticipated.  Time:  I spent 7 minutes with the patient via telehealth technology discussing the above problems/concerns.    Roxy Horseman, PA-C

## 2021-04-08 ENCOUNTER — Telehealth: Payer: Self-pay | Admitting: Physician Assistant

## 2021-04-08 DIAGNOSIS — B9689 Other specified bacterial agents as the cause of diseases classified elsewhere: Secondary | ICD-10-CM

## 2021-04-08 DIAGNOSIS — J019 Acute sinusitis, unspecified: Secondary | ICD-10-CM

## 2021-04-08 DIAGNOSIS — L219 Seborrheic dermatitis, unspecified: Secondary | ICD-10-CM

## 2021-04-08 MED ORDER — PREDNISONE 20 MG PO TABS: 40.0000 mg | ORAL_TABLET | Freq: Every day | ORAL | 0 refills | Status: DC

## 2021-04-08 MED ORDER — DOXYCYCLINE HYCLATE 100 MG PO TABS: 100.0000 mg | ORAL_TABLET | Freq: Two times a day (BID) | ORAL | 0 refills | Status: DC

## 2021-04-08 NOTE — Progress Notes (Signed)
Virtual Visit Consent   Katherine Dunlap, you are scheduled for a virtual visit with a [facility] provider today.     Just as with appointments in the office, your consent must be obtained to participate.  Your consent will be active for this visit and any virtual visit you may have with one of our providers in the next 365 days.     If you have a MyChart account, a copy of this consent can be sent to you electronically.  All virtual visits are billed to your insurance company just like a traditional visit in the office.    As this is a virtual visit, video technology does not allow for your provider to perform a traditional examination.  This may limit your provider's ability to fully assess your condition.  If your provider identifies any concerns that need to be evaluated in person or the need to arrange testing (such as labs, EKG, etc.), we will make arrangements to do so.     Although advances in technology are sophisticated, we cannot ensure that it will always work on either your end or our end.  If the connection with a video visit is poor, the visit may have to be switched to a telephone visit.  With either a video or telephone visit, we are not always able to ensure that we have a secure connection.     I need to obtain your verbal consent now.   Are you willing to proceed with your visit today?    Katherine Dunlap has provided verbal consent on 04/08/2021 for a virtual visit (video or telephone).   [name], PA-C   Date: 04/08/2021 2:24 PM   Virtual Visit via Video Note   I, [name], connected with  Katherine Dunlap  ([id number], [birth year]) on 04/08/21 at  1:30 PM EST by a video-enabled telemedicine application and verified that I am speaking with the correct person using two identifiers.  Location: Patient: Virtual Visit Location Patient: Home Provider: Virtual Visit Location Provider: Home Office   I discussed the limitations of evaluation and management by telemedicine  and the availability of in person appointments. The patient expressed understanding and agreed to proceed.    History of Present Illness: Katherine Dunlap is a 25 y.o. who identifies as a female who was assigned female at birth, and is being seen today for continued scalp irritation. She reports she has been seen by a PCP and dermatologist since she was last seen. She reports the Dermatologist gave her ketoconazole shampoo and told her to also use Selsun blue and conditioner. She reports that she did have a blow out of her air that precipitated this, even though minimal product was used and she had stylist use her selsun blue to wash her hair. She also reports having sinus symptoms for over 2 weeks. Having sinus congestion, left ear pressure. Has been using Flonase without improvement.   Problems:  Patient Active Problem List   Diagnosis Date Noted   Severe depression (HCC) 05/11/2018   Migraine with aura and without status migrainosus, not intractable 07/24/2016   Environmental allergies 12/07/2015    Allergies:  Allergies  Allergen Reactions   Penicillins Hives and Itching    Did it involve swelling of the face/tongue/throat, SOB, or low BP? No Did it involve sudden or severe rash/hives, skin peeling, or any reaction on the inside of your mouth or nose? Yes Did you need to seek medical attention at a hospital or doctor's office?  Yes When did it last happen?      April 2020 If all above answers are "NO", may proceed with cephalosporin use.    Medications:  Current Outpatient Medications:    doxycycline (VIBRA-TABS) 100 MG tablet, Take 1 tablet (100 mg total) by mouth 2 (two) times daily., Disp: 20 tablet, Rfl: 0   predniSONE (DELTASONE) 20 MG tablet, Take 2 tablets (40 mg total) by mouth daily with breakfast., Disp: 14 tablet, Rfl: 0   BLISOVI 24 FE 1-20 MG-MCG(24) tablet, Take 1 tablet by mouth daily., Disp: , Rfl:    fluticasone (FLONASE) 50 MCG/ACT nasal spray, Place 2 sprays into both  nostrils daily., Disp: 9.9 mL, Rfl: 0   gabapentin (NEURONTIN) 300 MG capsule, Take 300 mg by mouth 3 (three) times daily., Disp: , Rfl:    loratadine (CLARITIN) 10 MG tablet, Take 10 mg by mouth daily., Disp: , Rfl:   Observations/Objective: Patient is well-developed, well-nourished in no acute distress.  Resting comfortably at home.  Head is normocephalic, atraumatic.  No labored breathing.  Speech is clear and coherent with logical content.  Patient is alert and oriented at baseline.    Assessment and Plan: 1. Acute bacterial sinusitis - doxycycline (VIBRA-TABS) 100 MG tablet; Take 1 tablet (100 mg total) by mouth 2 (two) times daily.  Dispense: 20 tablet; Refill: 0  2. Seborrheic dermatitis of scalp - predniSONE (DELTASONE) 20 MG tablet; Take 2 tablets (40 mg total) by mouth daily with breakfast.  Dispense: 14 tablet; Refill: 0  - Will give Doxycycline for sinus infection - Prednisone provided for seborrheic dermatitis flare - Advised to f/u with PCP or dermatology for further evaluation (patient questioning having labs for autoimmune and/or allergy testing)  Follow Up Instructions: I discussed the assessment and treatment plan with the patient. The patient was provided an opportunity to ask questions and all were answered. The patient agreed with the plan and demonstrated an understanding of the instructions.  A copy of instructions were sent to the patient via MyChart unless otherwise noted below.    The patient was advised to call back or seek an in-person evaluation if the symptoms worsen or if the condition fails to improve as anticipated.  Time:  I spent 13 minutes with the patient via telehealth technology discussing the above problems/concerns.    [name], PA-C

## 2021-04-08 NOTE — Patient Instructions (Signed)
[name], thank you for joining [name], PA-C for today's virtual visit.  While this provider is not your primary care provider (PCP), if your PCP is located in our provider database this encounter information will be shared with them immediately following your visit.  Consent: (Patient) Katherine Dunlap provided verbal consent for this virtual visit at the beginning of the encounter.  Current Medications:  Current Outpatient Medications:    doxycycline (VIBRA-TABS) 100 MG tablet, Take 1 tablet (100 mg total) by mouth 2 (two) times daily., Disp: 20 tablet, Rfl: 0   predniSONE (DELTASONE) 20 MG tablet, Take 2 tablets (40 mg total) by mouth daily with breakfast., Disp: 14 tablet, Rfl: 0   BLISOVI 24 FE 1-20 MG-MCG(24) tablet, Take 1 tablet by mouth daily., Disp: , Rfl:    fluticasone (FLONASE) 50 MCG/ACT nasal spray, Place 2 sprays into both nostrils daily., Disp: 9.9 mL, Rfl: 0   gabapentin (NEURONTIN) 300 MG capsule, Take 300 mg by mouth 3 (three) times daily., Disp: , Rfl:    loratadine (CLARITIN) 10 MG tablet, Take 10 mg by mouth daily., Disp: , Rfl:    Medications ordered in this encounter:  Meds ordered this encounter  Medications   doxycycline (VIBRA-TABS) 100 MG tablet    Sig: Take 1 tablet (100 mg total) by mouth 2 (two) times daily.    Dispense:  20 tablet    Refill:  0    Order Specific Question:   Supervising Provider    Answer:   Hyacinth Meeker, BRIAN [[id number]]   predniSONE (DELTASONE) 20 MG tablet    Sig: Take 2 tablets (40 mg total) by mouth daily with breakfast.    Dispense:  14 tablet    Refill:  0    Order Specific Question:   Supervising Provider    Answer:   Eber Hong [[id number]]     *If you need refills on other medications prior to your next appointment, please contact your pharmacy*  Follow-Up: Call back or seek an in-person evaluation if the symptoms worsen or if the condition fails to improve as anticipated.  Other Instructions Seborrheic Dermatitis,  Adult Seborrheic dermatitis is a skin disease that causes red, scaly patches. It usually occurs on the scalp, and it is often called dandruff. The patches may appear on other parts of the body. Skin patches tend to appear where there are many oil glands in the skin. Areas of the body that are commonly affected include the: Scalp. Ears. Eyebrows. Face. Bearded area of [employer]. Skin folds of the body, such as the armpits, groin, and buttocks. Chest. The condition may come and go for no known reason, and it is often long-lasting (chronic). What are the causes? The cause of this condition is not known. What increases the risk? The following factors may make you more likely to develop this condition: Having certain conditions, such as: HIV (human immunodeficiency virus). AIDS (acquired immunodeficiency syndrome). Parkinson's disease. Mood disorders, such as depression. Being 57-33 years old. What are the signs or symptoms? Symptoms of this condition include: Thick scales on the scalp. Redness on the face or in the armpits. Skin that is flaky. The flakes may be white or yellow. Skin that seems oily or dry but is not helped with moisturizers. Itching or burning in the affected areas. How is this diagnosed? This condition is diagnosed with a medical history and physical exam. A sample of your skin may be tested (skin biopsy). You may need to see a skin specialist (  dermatologist). How is this treated? There is no cure for this condition, but treatment can help to manage the symptoms. You may get treatment to remove scales, lower the risk of skin infection, and reduce swelling or itching. Treatment may include: Creams that reduce skin yeast. Medicated shampoo. Moisturizing creams or ointments. Creams that reduce swelling and irritation (steroids). Follow these instructions at home: Apply over-the-counter and prescription medicines only as told by your health care provider. Use any  medicated shampoo, skin creams, or ointments only as told by your health care provider. Keep all follow-up visits as told by your health care provider. This is important. Contact a health care provider if: Your symptoms do not improve with treatment. Your symptoms get worse. You have new symptoms. Get help right away if: Your condition rapidly worsens with treatment. Summary Seborrheic dermatitis is a skin disease that causes red, scaly patches. Seborrheic dermatitis commonly affects the scalp, face, and skin folds. There is no cure for this condition, but treatment can help to manage the symptoms. This information is not intended to replace advice given to you by your health care provider. Make sure you discuss any questions you have with your health care provider. Document Revised: 01/27/2019 Document Reviewed: 01/27/2019 Elsevier Patient Education  2022 Elsevier Inc.   Sinusitis, Adult Sinusitis is inflammation of your sinuses. Sinuses are hollow spaces in the bones around your face. Your sinuses are located: Around your eyes. In the middle of your forehead. Behind your nose. In your cheekbones. Mucus normally drains out of your sinuses. When your nasal tissues become inflamed or swollen, mucus can become trapped or blocked. This allows bacteria, viruses, and fungi to grow, which leads to infection. Most infections of the sinuses are caused by a virus. Sinusitis can develop quickly. It can last for up to 4 weeks (acute) or for more than 12 weeks (chronic). Sinusitis often develops after a cold. What are the causes? This condition is caused by anything that creates swelling in the sinuses or stops mucus from draining. This includes: Allergies. Asthma. Infection from bacteria or viruses. Deformities or blockages in your nose or sinuses. Abnormal growths in the nose (nasal polyps). Pollutants, such as chemicals or irritants in the air. Infection from fungi (rare). What increases  the risk? You are more likely to develop this condition if you: Have a weak body defense system (immune system). Do a lot of swimming or diving. Overuse nasal sprays. Smoke. What are the signs or symptoms? The main symptoms of this condition are pain and a feeling of pressure around the affected sinuses. Other symptoms include: Stuffy nose or congestion. Thick drainage from your nose. Swelling and warmth over the affected sinuses. Headache. Upper toothache. A cough that may get worse at night. Extra mucus that collects in the throat or the back of the nose (postnasal drip). Decreased sense of smell and taste. Fatigue. A fever. Sore throat. Bad breath. How is this diagnosed? This condition is diagnosed based on: Your symptoms. Your medical history. A physical exam. Tests to find out if your condition is acute or chronic. This may include: Checking your nose for nasal polyps. Viewing your sinuses using a device that has a light (endoscope). Testing for allergies or bacteria. Imaging tests, such as an MRI or CT scan. In rare cases, a bone biopsy may be done to rule out more serious types of fungal sinus disease. How is this treated? Treatment for sinusitis depends on the cause and whether your condition is chronic or  acute. If caused by a virus, your symptoms should go away on their own within 10 days. You may be given medicines to relieve symptoms. They include: Medicines that shrink swollen nasal passages (topical intranasal decongestants). Medicines that treat allergies (antihistamines). A spray that eases inflammation of the nostrils (topical intranasal corticosteroids). Rinses that help get rid of thick mucus in your nose (nasal saline washes). If caused by bacteria, your health care provider may recommend waiting to see if your symptoms improve. Most bacterial infections will get better without antibiotic medicine. You may be given antibiotics if you have: A severe  infection. A weak immune system. If caused by narrow nasal passages or nasal polyps, you may need to have surgery. Follow these instructions at home: Medicines Take, use, or apply over-the-counter and prescription medicines only as told by your health care provider. These may include nasal sprays. If you were prescribed an antibiotic medicine, take it as told by your health care provider. Do not stop taking the antibiotic even if you start to feel better. Hydrate and humidify  Drink enough fluid to keep your urine pale yellow. Staying hydrated will help to thin your mucus. Use a cool mist humidifier to keep the humidity level in your home above 50%. Inhale steam for 10-15 minutes, 3-4 times a day, or as told by your health care provider. You can do this in the bathroom while a hot shower is running. Limit your exposure to cool or dry air. Rest Rest as much as possible. Sleep with your head raised (elevated). Make sure you get enough sleep each night. General instructions  Apply a warm, moist washcloth to your face 3-4 times a day or as told by your health care provider. This will help with discomfort. Wash your hands often with soap and water to reduce your exposure to germs. If soap and water are not available, use hand sanitizer. Do not smoke. Avoid being around people who are smoking (secondhand smoke). Keep all follow-up visits as told by your health care provider. This is important. Contact a health care provider if: You have a fever. Your symptoms get worse. Your symptoms do not improve within 10 days. Get help right away if: You have a severe headache. You have persistent vomiting. You have severe pain or swelling around your face or eyes. You have vision problems. You develop confusion. Your neck is stiff. You have trouble breathing. Summary Sinusitis is soreness and inflammation of your sinuses. Sinuses are hollow spaces in the bones around your face. This condition is  caused by nasal tissues that become inflamed or swollen. The swelling traps or blocks the flow of mucus. This allows bacteria, viruses, and fungi to grow, which leads to infection. If you were prescribed an antibiotic medicine, take it as told by your health care provider. Do not stop taking the antibiotic even if you start to feel better. Keep all follow-up visits as told by your health care provider. This is important. This information is not intended to replace advice given to you by your health care provider. Make sure you discuss any questions you have with your health care provider. Document Revised: 09/21/2017 Document Reviewed: 09/21/2017 Elsevier Patient Education  2022 [employer].    If you have been instructed to have an in-person evaluation today at a local Urgent Care facility, please use the link below. It will take you to a list of all of our available [facility], including address, phone number and hours of operation.  Please do not delay care.  [facility]  If you or a family member do not have a primary care provider, use the link below to schedule a visit and establish care. When you choose a [facility] primary care physician or advanced practice provider, you gain a long-term partner in health. Find a Primary Care Provider  Learn more about 's in-office and virtual care options: [facility] Now

## 2021-05-15 ENCOUNTER — Telehealth: Payer: Medicaid Other | Admitting: Physician Assistant

## 2021-05-15 DIAGNOSIS — L239 Allergic contact dermatitis, unspecified cause: Secondary | ICD-10-CM

## 2021-05-15 MED ORDER — PREDNISONE 10 MG PO TABS: ORAL_TABLET | ORAL | 0 refills | Status: AC

## 2021-05-15 NOTE — Progress Notes (Signed)
Virtual Visit Consent   Katherine Dunlap, you are scheduled for a virtual visit with a [facility] provider today.     Just as with appointments in the office, your consent must be obtained to participate.  Your consent will be active for this visit and any virtual visit you may have with one of our providers in the next 365 days.     If you have a MyChart account, a copy of this consent can be sent to you electronically.  All virtual visits are billed to your insurance company just like a traditional visit in the office.    As this is a virtual visit, video technology does not allow for your provider to perform a traditional examination.  This may limit your provider's ability to fully assess your condition.  If your provider identifies any concerns that need to be evaluated in person or the need to arrange testing (such as labs, EKG, etc.), we will make arrangements to do so.     Although advances in technology are sophisticated, we cannot ensure that it will always work on either your end or our end.  If the connection with a video visit is poor, the visit may have to be switched to a telephone visit.  With either a video or telephone visit, we are not always able to ensure that we have a secure connection.     I need to obtain your verbal consent now.   Are you willing to proceed with your visit today?    Katherine Dunlap has provided verbal consent on 05/15/2021 for a virtual visit (video or telephone).   Katherine Dunlap, [place]   Date: 05/15/2021 2:36 PM   Virtual Visit via Video Note   I, Katherine Dunlap, connected with  Katherine Dunlap  ([id number], 12-Jul-1995) on 05/15/21 at  2:45 PM EST by a video-enabled telemedicine application and verified that I am speaking with the correct person using two identifiers.  Location: Patient: Virtual Visit Location Patient: Home Provider: Virtual Visit Location Provider: Home Office   I discussed the limitations of evaluation and management by telemedicine  and the availability of in person appointments. The patient expressed understanding and agreed to proceed.    History of Present Illness: Katherine Dunlap is a 26 y.o. who identifies as a female who was assigned female at birth, and is being seen today for rash and burning/itching of face after applying a new lotion to the face two days ago. Notes symptoms starting soon after application and persisting despite washing off and using OTC benadryl Denies fever, chills. Denies swelling of face. Denies rash elsewhere.  HPI: HPI  Problems:  Patient Active Problem List   Diagnosis Date Noted   Severe depression ([place]) 05/11/2018   Migraine with aura and without status migrainosus, not intractable 07/24/2016   Environmental allergies 12/07/2015    Allergies:  Allergies  Allergen Reactions   Penicillins Hives and Itching    Did it involve swelling of the face/tongue/throat, SOB, or low BP? No Did it involve sudden or severe rash/hives, skin peeling, or any reaction on the inside of your mouth or nose? Yes Did you need to seek medical attention at a hospital or doctor's office? Yes When did it last happen?      April 2020 If all above answers are NO, may proceed with cephalosporin use.    Medications:  Current Outpatient Medications:    Etonogestrel (NEXPLANON [place]), Inject into the skin., Disp: , Rfl:    predniSONE (  DELTASONE) 10 MG tablet, Take 4 tablets (40 mg total) by mouth daily with breakfast for 3 days, THEN 3 tablets (30 mg total) daily with breakfast for 3 days, THEN 2 tablets (20 mg total) daily with breakfast for 3 days, THEN 1 tablet (10 mg total) daily with breakfast for 3 days., Disp: 30 tablet, Rfl: 0   fluticasone (FLONASE) 50 MCG/ACT nasal spray, Place 2 sprays into both nostrils daily., Disp: 9.9 mL, Rfl: 0   gabapentin (NEURONTIN) 300 MG capsule, Take 300 mg by mouth 3 (three) times daily., Disp: , Rfl:    loratadine (CLARITIN) 10 MG tablet, Take 10 mg by mouth daily., Disp: , Rfl:    Observations/Objective: Patient is well-developed, well-nourished in no acute distress.  Resting comfortably at home.  Head is normocephalic, atraumatic.  No labored breathing. Speech is clear and coherent with logical content.  Patient is alert and oriented at baseline.  Diffuse maculopapular rash of face noted without vesicular lesions, concerning for an allergic dermatitis.  Assessment and Plan: 1. Allergic dermatitis - predniSONE (DELTASONE) 10 MG tablet; Take 4 tablets (40 mg total) by mouth daily with breakfast for 3 days, THEN 3 tablets (30 mg total) daily with breakfast for 3 days, THEN 2 tablets (20 mg total) daily with breakfast for 3 days, THEN 1 tablet (10 mg total) daily with breakfast for 3 days.  Dispense: 30 tablet; Refill: 0  Avoidance of offending agent. Keep skin clean and dry. Can usher Benadryl OTC if needed. Start prednisone taper as we want to avoid topical steroids to the face. Once healed, recommend if needing a facial moisturizer to try Cetaphil or Cerave.   Follow Up Instructions: I discussed the assessment and treatment plan with the patient. The patient was provided an opportunity to ask questions and all were answered. The patient agreed with the plan and demonstrated an understanding of the instructions.  A copy of instructions were sent to the patient via MyChart unless otherwise noted below.   The patient was advised to call back or seek an in-person evaluation if the symptoms worsen or if the condition fails to improve as anticipated.  Time:  I spent 10 minutes with the patient via telehealth technology discussing the above problems/concerns.    Katherine Rio, PA-C

## 2021-05-15 NOTE — Patient Instructions (Signed)
°  [name], thank you for joining Piedad Climes, PA-C for today's virtual visit.  While this provider is not your primary care provider (PCP), if your PCP is located in our provider database this encounter information will be shared with them immediately following your visit.  Consent: (Patient) Katherine Dunlap provided verbal consent for this virtual visit at the beginning of the encounter.  Current Medications:  Current Outpatient Medications:    BLISOVI 24 FE 1-20 MG-MCG(24) tablet, Take 1 tablet by mouth daily., Disp: , Rfl:    doxycycline (VIBRA-TABS) 100 MG tablet, Take 1 tablet (100 mg total) by mouth 2 (two) times daily., Disp: 20 tablet, Rfl: 0   fluticasone (FLONASE) 50 MCG/ACT nasal spray, Place 2 sprays into both nostrils daily., Disp: 9.9 mL, Rfl: 0   gabapentin (NEURONTIN) 300 MG capsule, Take 300 mg by mouth 3 (three) times daily., Disp: , Rfl:    loratadine (CLARITIN) 10 MG tablet, Take 10 mg by mouth daily., Disp: , Rfl:    predniSONE (DELTASONE) 20 MG tablet, Take 2 tablets (40 mg total) by mouth daily with breakfast., Disp: 14 tablet, Rfl: 0   Medications ordered in this encounter:  No orders of the defined types were placed in this encounter.    *If you need refills on other medications prior to your next appointment, please contact your pharmacy*  Follow-Up: Call back or seek an in-person evaluation if the symptoms worsen or if the condition fails to improve as anticipated.  Other Instructions Please take the steroid as directed. Keep skin clean and dry. You can continue Benadryl OTC as needed. Once things are fully resolved, you can use a facial moisturizer like Cetaphil or Cerave which will be more gentle on the skin and less likely to cause a reaction.    If you have been instructed to have an in-person evaluation today at a local Urgent Care facility, please use the link below. It will take you to a list of all of our available [facility],  including address, phone number and hours of operation. Please do not delay care.  [facility]  If you or a family member do not have a primary care provider, use the link below to schedule a visit and establish care. When you choose a [facility] primary care physician or advanced practice provider, you gain a long-term partner in health. Find a Primary Care Provider  Learn more about [facility]'s in-office and virtual care options: [facility] - Get Care Now

## 2021-06-11 ENCOUNTER — Telehealth: Payer: Medicaid Other | Admitting: Emergency Medicine

## 2021-06-11 ENCOUNTER — Telehealth: Payer: Medicaid Other

## 2021-06-11 DIAGNOSIS — L209 Atopic dermatitis, unspecified: Secondary | ICD-10-CM

## 2021-06-11 MED ORDER — PREDNISONE 10 MG PO TABS: ORAL_TABLET | ORAL | 0 refills | Status: DC

## 2021-06-11 NOTE — Patient Instructions (Signed)
°  [name], thank you for joining [name], NP for today's virtual visit.  While this provider is not your primary care provider (PCP), if your PCP is located in our provider database this encounter information will be shared with them immediately following your visit.  Consent: (Patient) Katherine Dunlap provided verbal consent for this virtual visit at the beginning of the encounter.  Current Medications:  Current Outpatient Medications:    predniSONE (DELTASONE) 10 MG tablet, Take 4 tablets once a day for 4 days, then 3 tablets once a day for 4 days, then 2 tablets once day for 4 days, then 1 tablet once a day for 4 days., Disp: 40 tablet, Rfl: 0   Etonogestrel (NEXPLANON [place]), Inject into the skin., Disp: , Rfl:    fluticasone (FLONASE) 50 MCG/ACT nasal spray, Place 2 sprays into both nostrils daily., Disp: 9.9 mL, Rfl: 0   gabapentin (NEURONTIN) 300 MG capsule, Take 300 mg by mouth 3 (three) times daily., Disp: , Rfl:    loratadine (CLARITIN) 10 MG tablet, Take 10 mg by mouth daily., Disp: , Rfl:    Medications ordered in this encounter:  Meds ordered this encounter  Medications   predniSONE (DELTASONE) 10 MG tablet    Sig: Take 4 tablets once a day for 4 days, then 3 tablets once a day for 4 days, then 2 tablets once day for 4 days, then 1 tablet once a day for 4 days.    Dispense:  40 tablet    Refill:  0     *If you need refills on other medications prior to your next appointment, please contact your pharmacy*  Follow-Up: Call back or seek an in-person evaluation if the symptoms worsen or if the condition fails to improve as anticipated.  Other Instructions Please follow-up with dermatology or your primary care provider for further help with your eczema/allergic dermatitis.  Make sure you use hypoallergenicsensitive skin products.   If you have been instructed to have an in-person evaluation today at a local Urgent Care facility, please use the link below. It will take you to a  list of all of our available [facility], including address, phone number and hours of operation. Please do not delay care.  [facility]  If you or a family member do not have a primary care provider, use the link below to schedule a visit and establish care. When you choose a [facility] primary care physician or advanced practice provider, you gain a long-term partner in health. Find a Primary Care Provider  Learn more about [facility]'s in-office and virtual care options:  - Get Care Now

## 2021-06-11 NOTE — Progress Notes (Signed)
Virtual Visit Consent   Katherine Dunlap, you are scheduled for a virtual visit with a [facility] provider today.     Just as with appointments in the office, your consent must be obtained to participate.  Your consent will be active for this visit and any virtual visit you may have with one of our providers in the next 365 days.     If you have a MyChart account, a copy of this consent can be sent to you electronically.  All virtual visits are billed to your insurance company just like a traditional visit in the office.    As this is a virtual visit, video technology does not allow for your provider to perform a traditional examination.  This may limit your provider's ability to fully assess your condition.  If your provider identifies any concerns that need to be evaluated in person or the need to arrange testing (such as labs, EKG, etc.), we will make arrangements to do so.     Although advances in technology are sophisticated, we cannot ensure that it will always work on either your end or our end.  If the connection with a video visit is poor, the visit may have to be switched to a telephone visit.  With either a video or telephone visit, we are not always able to ensure that we have a secure connection.     I need to obtain your verbal consent now.   Are you willing to proceed with your visit today?    Katherine Dunlap has provided verbal consent on 06/11/2021 for a virtual visit (video or telephone).   [name], NP   Date: 06/11/2021 3:45 PM   Virtual Visit via Video Note   I, [name], connected with  Katherine Dunlap  ([id number], [birth year]) on 06/11/21 at  3:30 PM EST by a video-enabled telemedicine application and verified that I am speaking with the correct person using two identifiers.  Location: Patient: Virtual Visit Location Patient: Home Provider: Virtual Visit Location Provider: Home Office   I discussed the limitations of evaluation and management by telemedicine and the  availability of in person appointments. The patient expressed understanding and agreed to proceed.    History of Present Illness: Katherine Dunlap is a 26 y.o. who identifies as a female who was assigned female at birth, and is being seen today for facial rash.  Patient has had sensitive skin and recurrent facial rash for quite a while.  She has seen dermatology and dermatology questions whether or not she has psoriasis.  When she is taking oral steroids, her face will clear and the burning and itching and swelling that she gets in her face or her scalp temporarily goes away.  It always returns.  Back on January 11 of this year, patient had a recurrence of symptoms that resolved after she took oral prednisone.  She attributed it to use of Aveeno lotion with cocoa butter lotion mixed, but she had used both of these products before without reaction.  She had Nexplanon implanted in early January and since then has developed acne.  Several days ago she applied rubbing alcohol to her face to attempt to treat the acne, and this caused a burning, itching, swelling rash to her face.  HPI: HPI  Problems:  Patient Active Problem List   Diagnosis Date Noted   Severe depression (HCC) 05/11/2018   Migraine with aura and without status migrainosus, not intractable 07/24/2016   Environmental allergies 12/07/2015  Allergies:  Allergies  Allergen Reactions   Penicillins Hives and Itching    Did it involve swelling of the face/tongue/throat, SOB, or low BP? No Did it involve sudden or severe rash/hives, skin peeling, or any reaction on the inside of your mouth or nose? Yes Did you need to seek medical attention at a hospital or doctor's office? Yes When did it last happen?      April 2020 If all above answers are NO, may proceed with cephalosporin use.    Medications:  Current Outpatient Medications:    predniSONE (DELTASONE) 10 MG tablet, Take 4 tablets once a day for 4 days, then 3 tablets once a day for 4  days, then 2 tablets once day for 4 days, then 1 tablet once a day for 4 days., Disp: 40 tablet, Rfl: 0   Etonogestrel (NEXPLANON [place]), Inject into the skin., Disp: , Rfl:    fluticasone (FLONASE) 50 MCG/ACT nasal spray, Place 2 sprays into both nostrils daily., Disp: 9.9 mL, Rfl: 0   gabapentin (NEURONTIN) 300 MG capsule, Take 300 mg by mouth 3 (three) times daily., Disp: , Rfl:    loratadine (CLARITIN) 10 MG tablet, Take 10 mg by mouth daily., Disp: , Rfl:   Observations/Objective: Patient is well-developed, well-nourished in no acute distress.  Resting comfortably  at home.  Head is normocephalic, atraumatic.  No labored breathing.  Speech is clear and coherent with logical content.  Patient is alert and oriented at baseline.  It is difficult to see by video, but she does have a macular/patchy facial rash.  Assessment and Plan: 1. Atopic dermatitis, unspecified type  I have nothing to offer her besides yet another course of prednisone.  We discussed the utility in taking that and she is decided to choose it so that she can get relief from her facial symptoms right now.  We discussed how she needs to follow-up with dermatology and that there are other treatments that could be more effective for her, but that dermatology will have to prescribe them .  We discussed not applying rubbing alcohol to her face and choosing only hypoallergenic/sensitive skin products for her skin, especially her face.   Follow Up Instructions: I discussed the assessment and treatment plan with the patient. The patient was provided an opportunity to ask questions and all were answered. The patient agreed with the plan and demonstrated an understanding of the instructions.  A copy of instructions were sent to the patient via MyChart unless otherwise noted below.   The patient was advised to call back or seek an in-person evaluation if the symptoms worsen or if the condition fails to improve as anticipated.  Time:  I  spent 15 minutes with the patient via telehealth technology discussing the above problems/concerns.    Carvel Getting, NP

## 2021-07-17 ENCOUNTER — Ambulatory Visit: Payer: No Typology Code available for payment source | Admitting: Neurology

## 2021-07-18 ENCOUNTER — Ambulatory Visit: Payer: No Typology Code available for payment source | Admitting: Neurology

## 2021-08-04 ENCOUNTER — Telehealth: Payer: Medicaid Other | Admitting: Nurse Practitioner

## 2021-08-04 DIAGNOSIS — B3731 Acute candidiasis of vulva and vagina: Secondary | ICD-10-CM

## 2021-08-04 MED ORDER — FLUCONAZOLE 150 MG PO TABS: ORAL_TABLET | ORAL | 0 refills | Status: DC

## 2021-08-04 NOTE — Progress Notes (Signed)
? ?Virtual Visit Consent  ? ?Katherine Dunlap, you are scheduled for a virtual visit with Mary-Margaret Hassell Done, [place], a [facility] provider, today.   ?  ?Just as with appointments in the office, your consent must be obtained to participate.  Your consent will be active for this visit and any virtual visit you may have with one of our providers in the next 365 days.   ?  ?If you have a MyChart account, a copy of this consent can be sent to you electronically.  All virtual visits are billed to your insurance company just like a traditional visit in the office.   ? ?As this is a virtual visit, video technology does not allow for your provider to perform a traditional examination.  This may limit your provider's ability to fully assess your condition.  If your provider identifies any concerns that need to be evaluated in person or the need to arrange testing (such as labs, EKG, etc.), we will make arrangements to do so.   ?  ?Although advances in technology are sophisticated, we cannot ensure that it will always work on either your end or our end.  If the connection with a video visit is poor, the visit may have to be switched to a telephone visit.  With either a video or telephone visit, we are not always able to ensure that we have a secure connection.    ? ?I need to obtain your verbal consent now.   Are you willing to proceed with your visit today? YES ?  ?Katherine Dunlap has provided verbal consent on 08/04/2021 for a virtual visit (video or telephone). ?  ?Mary-Margaret Hassell Done, FNP  ? ?Date: 08/04/2021 12:44 PM ? ? ?Virtual Visit via Video Note  ? ?I, Mary-Margaret Hassell Done, connected with Katherine Dunlap ([id number], 1995-07-26) on 08/04/21 at 12:45 PM EDT by a video-enabled telemedicine application and verified that I am speaking with the correct person using two identifiers. ? ?Location: ?Patient: Virtual Visit Location Patient: Home ?Provider: Virtual Visit Location Provider: Mobile ?  ?I discussed the limitations of evaluation and  management by telemedicine and the availability of in person appointments. The patient expressed understanding and agreed to proceed.   ? ?History of Present Illness: ?Katherine Dunlap is a 26 y.o. who identifies as a female who was assigned female at birth, and is being seen today for yeast. ? ?HPI: Patient said she developed a white itchy discharge at the end of her last period. She used monistat OTC but that did not fully relieve her symptoms. ?  ?Review of Systems  ?Constitutional:  Negative for diaphoresis and weight loss.  ?Eyes:  Negative for blurred vision, double vision and pain.  ?Respiratory:  Negative for shortness of breath.   ?Cardiovascular:  Negative for chest pain, palpitations, orthopnea and leg swelling.  ?Gastrointestinal:  Negative for abdominal pain.  ?Skin:  Negative for rash.  ?Neurological:  Negative for dizziness, sensory change, loss of consciousness, weakness and headaches.  ?Endo/Heme/Allergies:  Negative for polydipsia. Does not bruise/bleed easily.  ?Psychiatric/Behavioral:  Negative for memory loss. The patient does not have insomnia.   ?All other systems reviewed and are negative. ? ?Problems:  ?Patient Active Problem List  ? Diagnosis Date Noted  ? Severe depression ([place]) 05/11/2018  ? Migraine with aura and without status migrainosus, not intractable 07/24/2016  ? Environmental allergies 12/07/2015  ?  ?Allergies:  ?Allergies  ?Allergen Reactions  ? Penicillins Hives and Itching  ?  Did it involve swelling of the  face/tongue/throat, SOB, or low BP? No ?Did it involve sudden or severe rash/hives, skin peeling, or any reaction on the inside of your mouth or nose? Yes ?Did you need to seek medical attention at a hospital or doctor's office? Yes ?When did it last happen?      April 2020 ?If all above answers are ?NO?, may proceed with cephalosporin use. ?  ? ?Medications:  ?Current Outpatient Medications:  ?  fluconazole (DIFLUCAN) 150 MG tablet, 1 po q week x 2 weeks, Disp: 2 tablet, Rfl:  0 ?  Etonogestrel (NEXPLANON [place]), Inject into the skin., Disp: , Rfl:  ?  fluticasone (FLONASE) 50 MCG/ACT nasal spray, Place 2 sprays into both nostrils daily., Disp: 9.9 mL, Rfl: 0 ?  gabapentin (NEURONTIN) 300 MG capsule, Take 300 mg by mouth 3 (three) times daily., Disp: , Rfl:  ?  loratadine (CLARITIN) 10 MG tablet, Take 10 mg by mouth daily., Disp: , Rfl:  ?  predniSONE (DELTASONE) 10 MG tablet, Take 4 tablets once a day for 4 days, then 3 tablets once a day for 4 days, then 2 tablets once day for 4 days, then 1 tablet once a day for 4 days., Disp: 40 tablet, Rfl: 0 ? ?Observations/Objective: ?Patient is well-developed, well-nourished in no acute distress.  ?Resting comfortably  at home.  ?Head is normocephalic, atraumatic.  ?No labored breathing.  ?Speech is clear and coherent with logical content.  ?Patient is alert and oriented at baseline.  ? ? ?Assessment and Plan: ? ?Katherine Dunlap in today with chief complaint of No chief complaint on file. ? ? ?1. Vaginal candidiasis ?No bubble baths ?Void after intercourse ?Avoid scratching ? ?Meds ordered this encounter  ?Medications  ? fluconazole (DIFLUCAN) 150 MG tablet  ?  Sig: 1 po q week x 2 weeks  ?  Dispense:  2 tablet  ?  Refill:  0  ?  Order Specific Question:   Supervising Provider  ?  Answer:   Noemi Chapel [[id number]]  ? ? ? ? ?Follow Up Instructions: ?I discussed the assessment and treatment plan with the patient. The patient was provided an opportunity to ask questions and all were answered. The patient agreed with the plan and demonstrated an understanding of the instructions.  A copy of instructions were sent to the patient via MyChart. ? ?The patient was advised to call back or seek an in-person evaluation if the symptoms worsen or if the condition fails to improve as anticipated. ? ?Time:  ?I spent 11 minutes with the patient via telehealth technology discussing the above problems/concerns.   ? ?Mary-Margaret Hassell Done, FNP ? ?

## 2021-08-04 NOTE — Patient Instructions (Signed)
Vaginal Yeast Infection, Adult °Vaginal yeast infection is a condition that causes vaginal discharge as well as soreness, swelling, and redness (inflammation) of the vagina. This is a common condition. Some women get this infection frequently. °What are the causes? °This condition is caused by a change in the normal balance of the yeast (Candida) and normal bacteria that live in the vagina. This change causes an overgrowth of yeast, which causes the inflammation. °What increases the risk? °The condition is more likely to develop in women who: °Take antibiotic medicines. °Have diabetes. °Take birth control pills. °Are pregnant. °Douche often. °Have a weak body defense system (immune system). °Have been taking steroid medicines for a long time. °Frequently wear tight clothing. °What are the signs or symptoms? °Symptoms of this condition include: °White, thick, creamy vaginal discharge. °Swelling, itching, redness, and irritation of the vagina. The lips of the vagina (labia) may be affected as well. °Pain or a burning feeling while urinating. °Pain during sex. °How is this diagnosed? °This condition is diagnosed based on: °Your medical history. °A physical exam. °A pelvic exam. Your health care provider will examine a sample of your vaginal discharge under a microscope. Your health care provider may send this sample for testing to confirm the diagnosis. °How is this treated? °This condition is treated with medicine. Medicines may be over-the-counter or prescription. You may be told to use one or more of the following: °Medicine that is taken by mouth (orally). °Medicine that is applied as a cream (topically). °Medicine that is inserted directly into the vagina (suppository). °Follow these instructions at home: °Take or apply over-the-counter and prescription medicines only as told by your health care provider. °Do not use tampons until your health care provider approves. °Do not have sex until your infection has  cleared. Sex can prolong or worsen your symptoms of infection. Ask your health care provider when it is safe to resume sexual activity. °Keep all follow-up visits. This is important. °How is this prevented? ° °Do not wear tight clothes, such as pantyhose or tight pants. °Wear breathable cotton underwear. °Do not use douches, perfumed soap, creams, or powders. °Wipe from front to back after using the toilet. °If you have diabetes, keep your blood sugar levels under control. °Ask your health care provider for other ways to prevent yeast infections. °Contact a health care provider if: °You have a fever. °Your symptoms go away and then return. °Your symptoms do not get better with treatment. °Your symptoms get worse. °You have new symptoms. °You develop blisters in or around your vagina. °You have blood coming from your vagina and it is not your menstrual period. °You develop pain in your abdomen. °Summary °Vaginal yeast infection is a condition that causes discharge as well as soreness, swelling, and redness (inflammation) of the vagina. °This condition is treated with medicine. Medicines may be over-the-counter or prescription. °Take or apply over-the-counter and prescription medicines only as told by your health care provider. °Do not douche. Resume sexual activity or use of tampons as instructed by your health care provider. °Contact a health care provider if your symptoms do not get better with treatment or your symptoms go away and then return. °This information is not intended to replace advice given to you by your health care provider. Make sure you discuss any questions you have with your health care provider. °Document Revised: 07/09/2020 Document Reviewed: 07/09/2020 °Elsevier Patient Education © 2022 [employer] ° °

## 2021-08-19 ENCOUNTER — Telehealth: Payer: Medicaid Other | Admitting: Nurse Practitioner

## 2021-08-19 DIAGNOSIS — L309 Dermatitis, unspecified: Secondary | ICD-10-CM

## 2021-08-19 MED ORDER — PREDNISONE 10 MG PO TABS: ORAL_TABLET | ORAL | 0 refills | Status: DC

## 2021-08-19 MED ORDER — KETOCONAZOLE 2 % EX SHAM: 1.0000 "application " | MEDICATED_SHAMPOO | CUTANEOUS | 0 refills | Status: AC

## 2021-08-19 NOTE — Progress Notes (Signed)
?Virtual Visit Consent  ? ?Katherine Dunlap, you are scheduled for a virtual visit with a [facility] provider today.   ?  ?Just as with appointments in the office, your consent must be obtained to participate.  Your consent will be active for this visit and any virtual visit you may have with one of our providers in the next 365 days.   ?  ?If you have a MyChart account, a copy of this consent can be sent to you electronically.  All virtual visits are billed to your insurance company just like a traditional visit in the office.   ? ?As this is a virtual visit, video technology does not allow for your provider to perform a traditional examination.  This may limit your provider's ability to fully assess your condition.  If your provider identifies any concerns that need to be evaluated in person or the need to arrange testing (such as labs, EKG, etc.), we will make arrangements to do so.   ?  ?Although advances in technology are sophisticated, we cannot ensure that it will always work on either your end or our end.  If the connection with a video visit is poor, the visit may have to be switched to a telephone visit.  With either a video or telephone visit, we are not always able to ensure that we have a secure connection.    ? ?I need to obtain your verbal consent now.   Are you willing to proceed with your visit today?  ?  ?Katherine Dunlap has provided verbal consent on 08/19/2021 for a virtual visit (video or telephone). ?  ?Katherine Simas, FNP  ? ?Date: 08/19/2021 12:10 PM ? ? ?Virtual Visit via Video Note  ? ?Katherine Dunlap, connected with  Katherine Dunlap  ([id number], Aug 26, 1995) on 08/19/21 at 12:15 PM EDT by a video-enabled telemedicine application and verified that I am speaking with the correct person using two identifiers. ? ?Location: ?Patient: Virtual Visit Location Patient: Home ?Provider: Virtual Visit Location Provider: Home Office ?  ?I discussed the limitations of evaluation and management by telemedicine and the  availability of in person appointments. The patient expressed understanding and agreed to proceed.   ? ?History of Present Illness: ?Katherine Dunlap is a 26 y.o. who identifies as a female who was assigned female at birth, and is being seen today with complaints of her scalp burning. She has been pulling her head up in a bun, she has been prescribed a ketoconazole shampoo in the past for similar symptoms and she has been using that without relief.  ? ?When this has happened in the past there was no known irritant.  ? ?She denies changing any products.  ?She has been seen by the dermatologist in the past as well  ? ?She has had relief in the past with steroids and using the shampoo.  ? ?This has happened 7-8 times over the past year. On average she has had this once a month leading up to 2023 this is the first time she has had an episode wince January  ? ?She alternated SeltzenBlue daily and ketoconazole when she has a flair up  ? ?Problems:  ?Patient Active Problem List  ? Diagnosis Date Noted  ? Severe depression (HCC) 05/11/2018  ? Migraine with aura and without status migrainosus, not intractable 07/24/2016  ? Environmental allergies 12/07/2015  ?  ?Allergies:  ?Allergies  ?Allergen Reactions  ? Penicillins Hives and Itching  ?  Did it involve swelling of the  face/tongue/throat, SOB, or low BP? No ?Did it involve sudden or severe rash/hives, skin peeling, or any reaction on the inside of your mouth or nose? Yes ?Did you need to seek medical attention at a hospital or doctor's office? Yes ?When did it last happen?      April 2020 ?If all above answers are ?NO?, may proceed with cephalosporin use. ?  ? ?Medications:  ?Current Outpatient Medications:  ?  Etonogestrel (NEXPLANON [place]), Inject into the skin., Disp: , Rfl:  ?  fluconazole (DIFLUCAN) 150 MG tablet, 1 po q week x 2 weeks, Disp: 2 tablet, Rfl: 0 ?  fluticasone (FLONASE) 50 MCG/ACT nasal spray, Place 2 sprays into both nostrils daily., Disp: 9.9 mL, Rfl: 0 ?   gabapentin (NEURONTIN) 300 MG capsule, Take 300 mg by mouth 3 (three) times daily., Disp: , Rfl:  ?  loratadine (CLARITIN) 10 MG tablet, Take 10 mg by mouth daily., Disp: , Rfl:  ?  predniSONE (DELTASONE) 10 MG tablet, Take 4 tablets once a day for 4 days, then 3 tablets once a day for 4 days, then 2 tablets once day for 4 days, then 1 tablet once a day for 4 days., Disp: 40 tablet, Rfl: 0 ? ?Observations/Objective: ?Patient is well-developed, well-nourished in no acute distress.  ?Resting comfortably at home.  ?Head is normocephalic, atraumatic.  ?No labored breathing.  ?Speech is clear and coherent with logical content.  ?Patient is alert and oriented at baseline.  ? ? ?Assessment and Plan: ?1. Dermatitis ?Recommended follow up with PCP and Dermatology to consider autoimmune testing and biopsy.  ?Take prednisone with food  ? ?- predniSONE (DELTASONE) 10 MG tablet; Take 4 tablets (40mg ) on days 1-4, then 3 tablets (30mg ) on days 5-8, then 2 tablets (20mg ) on days 9-11, then 1 tablet daily for days 12-14. Take with food.  Dispense: 37 tablet; Refill: 0 ?- ketoconazole (NIZORAL) 2 % shampoo; Apply 1 application. topically 2 (two) times a week.  Dispense: 120 mL; Refill: 0 ?   ? ?Follow Up Instructions: ?I discussed the assessment and treatment plan with the patient. The patient was provided an opportunity to ask questions and all were answered. The patient agreed with the plan and demonstrated an understanding of the instructions.  A copy of instructions were sent to the patient via MyChart unless otherwise noted below.  ? ?The patient was advised to call back or seek an in-person evaluation if the symptoms worsen or if the condition fails to improve as anticipated. ? ?Time:  ?I spent 15 minutes with the patient via telehealth technology discussing the above problems/concerns.   ? ? , FNP  ?

## 2021-09-03 ENCOUNTER — Telehealth: Payer: Medicaid Other | Admitting: Physician Assistant

## 2021-09-03 DIAGNOSIS — L03114 Cellulitis of left upper limb: Secondary | ICD-10-CM

## 2021-09-03 MED ORDER — DOXYCYCLINE HYCLATE 100 MG PO TABS
100.0000 mg | ORAL_TABLET | Freq: Two times a day (BID) | ORAL | 0 refills | Status: DC
Start: 2021-09-03 — End: 2021-10-19

## 2021-09-03 NOTE — Progress Notes (Signed)
?Virtual Visit Consent  ? ?Katherine Dunlap, you are scheduled for a virtual visit with a [facility] provider today.   ?  ?Just as with appointments in the office, your consent must be obtained to participate.  Your consent will be active for this visit and any virtual visit you may have with one of our providers in the next 365 days.   ?  ?If you have a MyChart account, a copy of this consent can be sent to you electronically.  All virtual visits are billed to your insurance company just like a traditional visit in the office.   ? ?As this is a virtual visit, video technology does not allow for your provider to perform a traditional examination.  This may limit your provider's ability to fully assess your condition.  If your provider identifies any concerns that need to be evaluated in person or the need to arrange testing (such as labs, EKG, etc.), we will make arrangements to do so.   ?  ?Although advances in technology are sophisticated, we cannot ensure that it will always work on either your end or our end.  If the connection with a video visit is poor, the visit may have to be switched to a telephone visit.  With either a video or telephone visit, we are not always able to ensure that we have a secure connection.   ? ?Also, by engaging in this virtual visit, you consent to the provision of healthcare. Additionally, you authorize for your insurance to be billed (if applicable) for the services provided during this visit. I also discussed with the patient that there may be a patient responsible charge related to this service. ? ?I need to obtain your verbal consent now.   Are you willing to proceed with your visit today?  ?  ?Katherine Dunlap has provided verbal consent on 09/03/2021 for a virtual visit (video or telephone). ?  ?Piedad Climes, PA-C  ? ?Date: 09/03/2021 3:16 PM ? ? ?Virtual Visit via Video Note  ? ?IPiedad Climes, connected with  Katherine Dunlap  ([id number], 09/05/1995) on 09/03/21 at  3:00 PM EDT by a  video-enabled telemedicine application and verified that I am speaking with the correct person using two identifiers. ? ?Location: ?Patient: Virtual Visit Location Patient: Home ?Provider: Virtual Visit Location Provider: Home Office ?  ?I discussed the limitations of evaluation and management by telemedicine and the availability of in person appointments. The patient expressed understanding and agreed to proceed.   ? ?History of Present Illness: ?Katherine Dunlap is a 26 y.o. who identifies as a female who was assigned female at birth, and is being seen today for blister of L upper arm noted after removal of her Nexplanon, now with redness and discomfort of the skin around the area, first noted a few days ago. Denies fever, chills. Blister has popped but still occasionally with some slight clear drainage.  ? ?HPI: HPI  ?Problems:  ?Patient Active Problem List  ? Diagnosis Date Noted  ? Severe depression (HCC) 05/11/2018  ? Migraine with aura and without status migrainosus, not intractable 07/24/2016  ? Environmental allergies 12/07/2015  ?  ?Allergies:  ?Allergies  ?Allergen Reactions  ? Penicillins Hives and Itching  ?  Did it involve swelling of the face/tongue/throat, SOB, or low BP? No ?Did it involve sudden or severe rash/hives, skin peeling, or any reaction on the inside of your mouth or nose? Yes ?Did you need to seek medical attention at a hospital  or doctor's office? Yes ?When did it last happen?      April 2020 ?If all above answers are ?NO?, may proceed with cephalosporin use. ?  ? ?Medications:  ?Current Outpatient Medications:  ?  doxycycline (VIBRA-TABS) 100 MG tablet, Take 1 tablet (100 mg total) by mouth 2 (two) times daily., Disp: 14 tablet, Rfl: 0 ?  fluconazole (DIFLUCAN) 150 MG tablet, 1 po q week x 2 weeks, Disp: 2 tablet, Rfl: 0 ?  fluticasone (FLONASE) 50 MCG/ACT nasal spray, Place 2 sprays into both nostrils daily., Disp: 9.9 mL, Rfl: 0 ?  gabapentin (NEURONTIN) 300 MG capsule, Take 300 mg by mouth  3 (three) times daily., Disp: , Rfl:  ?  ketoconazole (NIZORAL) 2 % shampoo, Apply 1 application. topically 2 (two) times a week., Disp: 120 mL, Rfl: 0 ?  loratadine (CLARITIN) 10 MG tablet, Take 10 mg by mouth daily., Disp: , Rfl:  ? ?Observations/Objective: ?Patient is well-developed, well-nourished in no acute distress.  ?Resting comfortably at home.  ?Head is normocephalic, atraumatic.  ?No labored breathing. ?Speech is clear and coherent with logical content.  ?Patient is alert and oriented at baseline.  ?Left upper arm with small 2-3 mm lesion which seems to have been vesicular at one point but has drained. Area of surrounding erythema, warmth and tenderness to patient's palpation noted.  ? ?Assessment and Plan: ?1. Cellulitis of left arm ?- doxycycline (VIBRA-TABS) 100 MG tablet; Take 1 tablet (100 mg total) by mouth 2 (two) times daily.  Dispense: 14 tablet; Refill: 0 ? ?Start Doxycycline. Cool compresses. Keep clean and dry. Start OTC antihistamine to help with local swelling. In-person follow-up if not resolving or any new/worsening symptoms ? ?Follow Up Instructions: ?I discussed the assessment and treatment plan with the patient. The patient was provided an opportunity to ask questions and all were answered. The patient agreed with the plan and demonstrated an understanding of the instructions.  A copy of instructions were sent to the patient via MyChart unless otherwise noted below.  ? ?The patient was advised to call back or seek an in-person evaluation if the symptoms worsen or if the condition fails to improve as anticipated. ? ?Time:  ?I spent 10 minutes with the patient via telehealth technology discussing the above problems/concerns.   ? ?Piedad Climes, PA-C ?

## 2021-09-03 NOTE — Patient Instructions (Signed)
?  [name], thank you for joining Piedad Climes, PA-C for today's virtual visit.  While this provider is not your primary care provider (PCP), if your PCP is located in our provider database this encounter information will be shared with them immediately following your visit. ? ?Consent: ?(Patient) Katherine Dunlap provided verbal consent for this virtual visit at the beginning of the encounter. ? ?Current Medications: ? ?Current Outpatient Medications:  ?  doxycycline (VIBRA-TABS) 100 MG tablet, Take 1 tablet (100 mg total) by mouth 2 (two) times daily., Disp: 14 tablet, Rfl: 0 ?  fluconazole (DIFLUCAN) 150 MG tablet, 1 po q week x 2 weeks, Disp: 2 tablet, Rfl: 0 ?  fluticasone (FLONASE) 50 MCG/ACT nasal spray, Place 2 sprays into both nostrils daily., Disp: 9.9 mL, Rfl: 0 ?  gabapentin (NEURONTIN) 300 MG capsule, Take 300 mg by mouth 3 (three) times daily., Disp: , Rfl:  ?  ketoconazole (NIZORAL) 2 % shampoo, Apply 1 application. topically 2 (two) times a week., Disp: 120 mL, Rfl: 0 ?  loratadine (CLARITIN) 10 MG tablet, Take 10 mg by mouth daily., Disp: , Rfl:   ? ?Medications ordered in this encounter:  ?Meds ordered this encounter  ?Medications  ? doxycycline (VIBRA-TABS) 100 MG tablet  ?  Sig: Take 1 tablet (100 mg total) by mouth 2 (two) times daily.  ?  Dispense:  14 tablet  ?  Refill:  0  ?  Order Specific Question:   Supervising Provider  ?  Answer:   Eber Hong [[id number]]  ?  ? ?*If you need refills on other medications prior to your next appointment, please contact your pharmacy* ? ?Follow-Up: ?Call back or seek an in-person evaluation if the symptoms worsen or if the condition fails to improve as anticipated. ? ?Other Instructions ?Keep skin clean and dry. ?Use OTC Benadryl at night as discussed. ?Cold compresses can also be helpful. ?Take the antibiotic as directed. ? ?If not resolving or if you note any new/worsening symptoms, please seek in-person evaluation ASAP. DO NOT DELAY CARE. ? ? ?If you have  been instructed to have an in-person evaluation today at a local Urgent Care facility, please use the link below. It will take you to a list of all of our available [facility], including address, phone number and hours of operation. Please do not delay care.  ?[facility] ? ?If you or a family member do not have a primary care provider, use the link below to schedule a visit and establish care. When you choose a [facility] primary care physician or advanced practice provider, you gain a long-term partner in health. ?Find a Primary Care Provider ? ?Learn more about [facility]'s in-office and virtual care options: ?[facility] - Get Care Now  ?

## 2021-10-19 ENCOUNTER — Telehealth: Payer: Medicaid Other | Admitting: Emergency Medicine

## 2021-10-19 DIAGNOSIS — B353 Tinea pedis: Secondary | ICD-10-CM

## 2021-10-19 MED ORDER — CLOTRIMAZOLE 1 % EX CREA: 1.0000 | TOPICAL_CREAM | Freq: Two times a day (BID) | CUTANEOUS | 0 refills | Status: DC

## 2021-10-19 NOTE — Progress Notes (Signed)
Virtual Visit Consent   Katherine Dunlap, you are scheduled for a virtual visit with a [facility] provider today. Just as with appointments in the office, your consent must be obtained to participate. Your consent will be active for this visit and any virtual visit you may have with one of our providers in the next 365 days. If you have a MyChart account, a copy of this consent can be sent to you electronically.  As this is a virtual visit, video technology does not allow for your provider to perform a traditional examination. This may limit your provider's ability to fully assess your condition. If your provider identifies any concerns that need to be evaluated in person or the need to arrange testing (such as labs, EKG, etc.), we will make arrangements to do so. Although advances in technology are sophisticated, we cannot ensure that it will always work on either your end or our end. If the connection with a video visit is poor, the visit may have to be switched to a telephone visit. With either a video or telephone visit, we are not always able to ensure that we have a secure connection.  By engaging in this virtual visit, you consent to the provision of healthcare and authorize for your insurance to be billed (if applicable) for the services provided during this visit. Depending on your insurance coverage, you may receive a charge related to this service.  I need to obtain your verbal consent now. Are you willing to proceed with your visit today? Katherine Dunlap has provided verbal consent on 10/19/2021 for a virtual visit (video or telephone). [name], NP  Date: 10/19/2021 12:46 PM  Virtual Visit via Video Note   I, [name], connected with  Katherine Dunlap  ([id number], 03/31/1996) on 10/19/21 at 12:45 PM EDT by a video-enabled telemedicine application and verified that I am speaking with the correct person using two identifiers.  Location: Patient: Virtual Visit Location Patient: Home Provider:  Virtual Visit Location Provider: Home Office   I discussed the limitations of evaluation and management by telemedicine and the availability of in person appointments. The patient expressed understanding and agreed to proceed.    History of Present Illness: Katherine Dunlap is a 26 y.o. who identifies as a female who was assigned female at birth, and is being seen today for athlete's foot.  She reports her boyfriend has athlete's foot and a couple of days ago she developed peeling skin on the soles of both of her feet that is not significantly painful but is uncomfortable.  She believes she also has athlete's foot.  It is not in between her toes.  She has been soaking it in green rubbing alcohol for no relief.  She is also tried using cortisone cream for no relief.  HPI: HPI  Problems:  Patient Active Problem List   Diagnosis Date Noted   Severe depression (HCC) 05/11/2018   Migraine with aura and without status migrainosus, not intractable 07/24/2016   Environmental allergies 12/07/2015    Allergies:  Allergies  Allergen Reactions   Penicillins Hives and Itching    Did it involve swelling of the face/tongue/throat, SOB, or low BP? No Did it involve sudden or severe rash/hives, skin peeling, or any reaction on the inside of your mouth or nose? Yes Did you need to seek medical attention at a hospital or doctor's office? Yes When did it last happen?      April 2020 If all above answers are "NO",  may proceed with cephalosporin use.    Medications:  Current Outpatient Medications:    fluticasone (FLONASE) 50 MCG/ACT nasal spray, Place 2 sprays into both nostrils daily., Disp: 9.9 mL, Rfl: 0   gabapentin (NEURONTIN) 300 MG capsule, Take 300 mg by mouth 3 (three) times daily., Disp: , Rfl:    ketoconazole (NIZORAL) 2 % shampoo, Apply 1 application. topically 2 (two) times a week., Disp: 120 mL, Rfl: 0   loratadine (CLARITIN) 10 MG tablet, Take 10 mg by mouth daily., Disp: , Rfl:    Observations/Objective: Patient is well-developed, well-nourished in no acute distress.  Resting comfortably  at home.  Head is normocephalic atraumatic.  No labored breathing.  Speech is clear and coherent with logical content.  Patient is alert and oriented at baseline.  It is difficult to see over video, but the soles of her feet do appear to be peeling and is consistent with athlete's foot.  Assessment and Plan: 1. Tinea pedis of both feet  Based on symptoms and recent exposure, most likely is tinea pedis.  Rx clotrimazole 1% cream BID. Reviewed supportive care measures.  Had a discussion about how it spreads and what to do to prevent it.  Follow Up Instructions: I discussed the assessment and treatment plan with the patient. The patient was provided an opportunity to ask questions and all were answered. The patient agreed with the plan and demonstrated an understanding of the instructions.  A copy of instructions were sent to the patient via MyChart unless otherwise noted below.   The patient was advised to call back or seek an in-person evaluation if the symptoms worsen or if the condition fails to improve as anticipated.  Time:  I spent 9 minutes with the patient via telehealth technology discussing the above problems/concerns.    [name], NP

## 2021-10-19 NOTE — Patient Instructions (Signed)
  [name], thank you for joining [name], NP for today's virtual visit.  While this provider is not your primary care provider (PCP), if your PCP is located in our provider database this encounter information will be shared with them immediately following your visit.  Consent: (Patient) Katherine Dunlap provided verbal consent for this virtual visit at the beginning of the encounter.  Current Medications:  Current Outpatient Medications:    fluticasone (FLONASE) 50 MCG/ACT nasal spray, Place 2 sprays into both nostrils daily., Disp: 9.9 mL, Rfl: 0   gabapentin (NEURONTIN) 300 MG capsule, Take 300 mg by mouth 3 (three) times daily., Disp: , Rfl:    ketoconazole (NIZORAL) 2 % shampoo, Apply 1 application. topically 2 (two) times a week., Disp: 120 mL, Rfl: 0   loratadine (CLARITIN) 10 MG tablet, Take 10 mg by mouth daily., Disp: , Rfl:    Medications ordered in this encounter:  No orders of the defined types were placed in this encounter.    *If you need refills on other medications prior to your next appointment, please contact your pharmacy*  Follow-Up: Call back or seek an in-person evaluation if the symptoms worsen or if the condition fails to improve as anticipated.  Other Instructions "Athlete's Foot" can spread through shared towels, clothing, bedding, etc., as well as hard surfaces (particularly in moist areas) such as shower stalls, locker room floors, pool areas, etc. The symptoms of Athlete's Foot include red, swollen, peeling, itchy skin between the toes (especially between the pinky toe and the one next to it). The sole and heel of the foot may also be affected. In severe cases, the skin on the feet can blister.  Athlete's foot can usually be treated with over-the-counter topical antifungal products I am recommending:Clotrimazole 1% cream or gel, apply to area twice per day  HOME CARE:  Keep feet clean, dry, and cool. Avoid using swimming pools, public showers, or foot  baths. Wear sandals when possible or air shoes out by alternating them every 2-3 days. Avoid wearing closed shoes and wearing socks made from fabric that doesn't dry easily (for example, nylon). Treat the infection with recommended medication  GET HELP RIGHT AWAY IF:  Symptoms that don't go away after treatment. Severe itching that persists. If your rash spreads or swells. If your rash begins to have drainage or smell. You develop a fever.   If you have been instructed to have an in-person evaluation today at a local Urgent Care facility, please use the link below. It will take you to a list of all of our available [facility], including address, phone number and hours of operation. Please do not delay care.  [facility]  If you or a family member do not have a primary care provider, use the link below to schedule a visit and establish care. When you choose a [facility] primary care physician or advanced practice provider, you gain a long-term partner in health. Find a Primary Care Provider  Learn more about [facility]'s in-office and virtual care options: [facility] - Get Care Now

## 2021-11-21 ENCOUNTER — Telehealth: Payer: Self-pay | Admitting: Physician Assistant

## 2021-11-21 DIAGNOSIS — B3731 Acute candidiasis of vulva and vagina: Secondary | ICD-10-CM

## 2021-11-21 MED ORDER — FLUCONAZOLE 150 MG PO TABS: 150.0000 mg | ORAL_TABLET | ORAL | 0 refills | Status: DC | PRN

## 2021-11-21 NOTE — Progress Notes (Signed)
Virtual Visit Consent   Katherine Dunlap, you are scheduled for a virtual visit with a [facility] provider today. Just as with appointments in the office, your consent must be obtained to participate. Your consent will be active for this visit and any virtual visit you may have with one of our providers in the next 365 days. If you have a MyChart account, a copy of this consent can be sent to you electronically.  As this is a virtual visit, video technology does not allow for your provider to perform a traditional examination. This may limit your provider's ability to fully assess your condition. If your provider identifies any concerns that need to be evaluated in person or the need to arrange testing (such as labs, EKG, etc.), we will make arrangements to do so. Although advances in technology are sophisticated, we cannot ensure that it will always work on either your end or our end. If the connection with a video visit is poor, the visit may have to be switched to a telephone visit. With either a video or telephone visit, we are not always able to ensure that we have a secure connection.  By engaging in this virtual visit, you consent to the provision of healthcare and authorize for your insurance to be billed (if applicable) for the services provided during this visit. Depending on your insurance coverage, you may receive a charge related to this service.  I need to obtain your verbal consent now. Are you willing to proceed with your visit today? [name] has provided verbal consent on 11/21/2021 for a virtual visit (video or telephone). [name], PA-C  Date: 11/21/2021 1:05 PM  Virtual Visit via Video Note   IMargaretann Loveless, connected with  Kelsa Jaworowski  ([id number], [birth year]) on 11/21/21 at  1:00 PM EDT by a video-enabled telemedicine application and verified that I am speaking with the correct person using two identifiers.  Location: Patient: Virtual Visit Location Patient:  Home Provider: Virtual Visit Location Provider: Home Office   I discussed the limitations of evaluation and management by telemedicine and the availability of in person appointments. The patient expressed understanding and agreed to proceed.    History of Present Illness: Katherine Dunlap is a 26 y.o. who identifies as a female who was assigned female at birth, and is being seen today for vaginal itching.  HPI: Vaginal Itching The patient's primary symptoms include genital itching and vaginal discharge. The patient's pertinent negatives include no genital lesions, genital odor, genital rash or vaginal bleeding. This is a recurrent problem. The current episode started in the past 7 days (3 days ago). The problem occurs constantly. The problem has been gradually worsening. The pain is mild. The problem affects both sides. She is not pregnant. Pertinent negatives include no chills, dysuria, fever or nausea. The vaginal discharge was white and thick. She has not been passing clots. She has not been passing tissue. Nothing aggravates the symptoms. She has tried nothing for the symptoms. The treatment provided no relief. She is not sexually active.      Problems:  Patient Active Problem List   Diagnosis Date Noted   Severe depression (HCC) 05/11/2018   Migraine with aura and without status migrainosus, not intractable 07/24/2016   Environmental allergies 12/07/2015    Allergies:  Allergies  Allergen Reactions   Penicillins Hives and Itching    Did it involve swelling of the face/tongue/throat, SOB, or low BP? No Did it involve sudden or severe rash/hives,  skin peeling, or any reaction on the inside of your mouth or nose? Yes Did you need to seek medical attention at a hospital or doctor's office? Yes When did it last happen?      April 2020 If all above answers are "NO", may proceed with cephalosporin use.    Medications:  Current Outpatient Medications:    fluconazole (DIFLUCAN) 150 MG tablet,  Take 1 tablet (150 mg total) by mouth every 3 (three) days as needed., Disp: 2 tablet, Rfl: 0   clotrimazole (LOTRIMIN) 1 % cream, Apply 1 Application topically 2 (two) times daily., Disp: 30 g, Rfl: 0   fluticasone (FLONASE) 50 MCG/ACT nasal spray, Place 2 sprays into both nostrils daily., Disp: 9.9 mL, Rfl: 0   gabapentin (NEURONTIN) 300 MG capsule, Take 300 mg by mouth 3 (three) times daily., Disp: , Rfl:    ketoconazole (NIZORAL) 2 % shampoo, Apply 1 application. topically 2 (two) times a week., Disp: 120 mL, Rfl: 0   loratadine (CLARITIN) 10 MG tablet, Take 10 mg by mouth daily., Disp: , Rfl:   Observations/Objective: Patient is well-developed, well-nourished in no acute distress.  Resting comfortably at home.  Head is normocephalic, atraumatic.  No labored breathing.  Speech is clear and coherent with logical content.  Patient is alert and oriented at baseline.    Assessment and Plan: 1. Yeast vaginitis - fluconazole (DIFLUCAN) 150 MG tablet; Take 1 tablet (150 mg total) by mouth every 3 (three) days as needed.  Dispense: 2 tablet; Refill: 0  - Diflucan prescribed - Recurrent issue, follows menstrual cycles - Will provide list of vaginal probiotics and vaginal washes in AVS - Can use A+D ointment or diaper rash cream for external irritation  Follow Up Instructions: I discussed the assessment and treatment plan with the patient. The patient was provided an opportunity to ask questions and all were answered. The patient agreed with the plan and demonstrated an understanding of the instructions.  A copy of instructions were sent to the patient via MyChart unless otherwise noted below.    The patient was advised to call back or seek an in-person evaluation if the symptoms worsen or if the condition fails to improve as anticipated.  Time:  I spent 11 minutes with the patient via telehealth technology discussing the above problems/concerns.    [name], PA-C

## 2021-11-21 NOTE — Patient Instructions (Signed)
  Katherine Dunlap, thank you for joining [name], PA-C for today's virtual visit.  While this provider is not your primary care provider (PCP), if your PCP is located in our provider database this encounter information will be shared with them immediately following your visit.  Consent: (Patient) Katherine Dunlap provided verbal consent for this virtual visit at the beginning of the encounter.  Current Medications:  Current Outpatient Medications:    fluconazole (DIFLUCAN) 150 MG tablet, Take 1 tablet (150 mg total) by mouth every 3 (three) days as needed., Disp: 2 tablet, Rfl: 0   clotrimazole (LOTRIMIN) 1 % cream, Apply 1 Application topically 2 (two) times daily., Disp: 30 g, Rfl: 0   fluticasone (FLONASE) 50 MCG/ACT nasal spray, Place 2 sprays into both nostrils daily., Disp: 9.9 mL, Rfl: 0   gabapentin (NEURONTIN) 300 MG capsule, Take 300 mg by mouth 3 (three) times daily., Disp: , Rfl:    ketoconazole (NIZORAL) 2 % shampoo, Apply 1 application. topically 2 (two) times a week., Disp: 120 mL, Rfl: 0   loratadine (CLARITIN) 10 MG tablet, Take 10 mg by mouth daily., Disp: , Rfl:    Medications ordered in this encounter:  Meds ordered this encounter  Medications   fluconazole (DIFLUCAN) 150 MG tablet    Sig: Take 1 tablet (150 mg total) by mouth every 3 (three) days as needed.    Dispense:  2 tablet    Refill:  0    Order Specific Question:   Supervising Provider    Answer:   Hyacinth Meeker, BRIAN [[id number]]     *If you need refills on other medications prior to your next appointment, please contact your pharmacy*  Follow-Up: Call back or seek an in-person evaluation if the symptoms worsen or if the condition fails to improve as anticipated.  Other Instructions Vaginal Probiotics: AZO vaginal probiotic OLLY Happy Hoo-Ha RAW Vaginal Care RenewLife Women's vaginal probiotic RepHresh Pro-B  Vaginal washes: Honey Pot Summer's Eve Vagisil Feminine cleanser    If you have been instructed  to have an in-person evaluation today at a local Urgent Care facility, please use the link below. It will take you to a list of all of our available [facility], including address, phone number and hours of operation. Please do not delay care.  [facility]  If you or a family member do not have a primary care provider, use the link below to schedule a visit and establish care. When you choose a [facility] primary care physician or advanced practice provider, you gain a long-term partner in health. Find a Primary Care Provider  Learn more about [facility]'s in-office and virtual care options: [facility] - Get Care Now

## 2022-01-01 ENCOUNTER — Telehealth: Payer: Self-pay | Admitting: Family

## 2022-01-01 DIAGNOSIS — J0100 Acute maxillary sinusitis, unspecified: Secondary | ICD-10-CM

## 2022-01-01 MED ORDER — FLUCONAZOLE 150 MG PO TABS: 150.0000 mg | ORAL_TABLET | ORAL | 0 refills | Status: DC | PRN

## 2022-01-01 MED ORDER — DOXYCYCLINE HYCLATE 100 MG PO TABS: 100.0000 mg | ORAL_TABLET | Freq: Two times a day (BID) | ORAL | 0 refills | Status: DC

## 2022-01-01 NOTE — Progress Notes (Signed)
Virtual Visit Consent   Katherine Dunlap, you are scheduled for a virtual visit with a [facility] provider today. Just as with appointments in the office, your consent must be obtained to participate. Your consent will be active for this visit and any virtual visit you may have with one of our providers in the next 365 days. If you have a MyChart account, a copy of this consent can be sent to you electronically.  As this is a virtual visit, video technology does not allow for your provider to perform a traditional examination. This may limit your provider's ability to fully assess your condition. If your provider identifies any concerns that need to be evaluated in person or the need to arrange testing (such as labs, EKG, etc.), we will make arrangements to do so. Although advances in technology are sophisticated, we cannot ensure that it will always work on either your end or our end. If the connection with a video visit is poor, the visit may have to be switched to a telephone visit. With either a video or telephone visit, we are not always able to ensure that we have a secure connection.  By engaging in this virtual visit, you consent to the provision of healthcare and authorize for your insurance to be billed (if applicable) for the services provided during this visit. Depending on your insurance coverage, you may receive a charge related to this service.  I need to obtain your verbal consent now. Are you willing to proceed with your visit today? Katherine Dunlap has provided verbal consent on 01/01/2022 for a virtual visit (video or telephone). Katherine Rodney, FNP  Date: 01/01/2022 8:37 AM  Virtual Visit via Video Note   I, Katherine Dunlap, connected with  Katherine Dunlap  ([id number], 1995/05/29) on 01/01/22 at  8:45 AM EDT by a video-enabled telemedicine application and verified that I am speaking with the correct person using two identifiers.  Location: Patient: Virtual Visit Location Patient: Home Provider:  Virtual Visit Location Provider: Home Office   I discussed the limitations of evaluation and management by telemedicine and the availability of in person appointments. The patient expressed understanding and agreed to proceed.    History of Present Illness: Katherine Dunlap is a 26 y.o. who identifies as a female who was assigned female at birth, and is being seen today for sinus problems for the last week. However, symptoms are worsening.   HPI: Sinusitis This is a new problem. The current episode started 1 to 4 weeks ago. The problem has been gradually worsening since onset. There has been no fever. Her pain is at a severity of 6/10. The pain is moderate. Associated symptoms include congestion, coughing, ear pain, headaches, sinus pressure, sneezing and a sore throat. Pertinent negatives include no chills or shortness of breath. Past treatments include acetaminophen and oral decongestants (neti pot). The treatment provided mild relief.    Problems:  Patient Active Problem List   Diagnosis Date Noted   Severe depression (HCC) 05/11/2018   Migraine with aura and without status migrainosus, not intractable 07/24/2016   Environmental allergies 12/07/2015    Allergies:  Allergies  Allergen Reactions   Penicillins Hives and Itching    Did it involve swelling of the face/tongue/throat, SOB, or low BP? No Did it involve sudden or severe rash/hives, skin peeling, or any reaction on the inside of your mouth or nose? Yes Did you need to seek medical attention at a hospital or doctor's office? Yes When did it last happen?  April 2020 If all above answers are "NO", may proceed with cephalosporin use.    Medications:  Current Outpatient Medications:    doxycycline (VIBRA-TABS) 100 MG tablet, Take 1 tablet (100 mg total) by mouth 2 (two) times daily., Disp: 20 tablet, Rfl: 0   fluticasone (FLONASE) 50 MCG/ACT nasal spray, Place 2 sprays into both nostrils daily., Disp: 9.9 mL, Rfl: 0   gabapentin  (NEURONTIN) 300 MG capsule, Take 300 mg by mouth 3 (three) times daily., Disp: , Rfl:    ketoconazole (NIZORAL) 2 % shampoo, Apply 1 application. topically 2 (two) times a week., Disp: 120 mL, Rfl: 0   loratadine (CLARITIN) 10 MG tablet, Take 10 mg by mouth daily., Disp: , Rfl:   Observations/Objective: Patient is well-developed, well-nourished in no acute distress.  Resting comfortably  at home.  Head is normocephalic, atraumatic.  No labored breathing.  Speech is clear and coherent with logical content.  Patient is alert and oriented at baseline.  Congestion and hoarse voice  Assessment and Plan: 1. Acute maxillary sinusitis, recurrence not specified - doxycycline (VIBRA-TABS) 100 MG tablet; Take 1 tablet (100 mg total) by mouth 2 (two) times daily.  Dispense: 20 tablet; Refill: 0  - Take meds as prescribed - Use a cool mist humidifier  -Use saline nose sprays frequently -Force fluids -For any cough or congestion  Use plain Mucinex- regular strength or max strength is fine -For fever or aces or pains- take tylenol or ibuprofen. -Throat lozenges if help -Follow up if symptoms worsen or do not improve   Follow Up Instructions: I discussed the assessment and treatment plan with the patient. The patient was provided an opportunity to ask questions and all were answered. The patient agreed with the plan and demonstrated an understanding of the instructions.  A copy of instructions were sent to the patient via MyChart unless otherwise noted below.     The patient was advised to call back or seek an in-person evaluation if the symptoms worsen or if the condition fails to improve as anticipated.  Time:  I spent 6 minutes with the patient via telehealth technology discussing the above problems/concerns.    Katherine Rodney, FNP

## 2022-01-16 ENCOUNTER — Telehealth: Payer: Self-pay | Admitting: Physician Assistant

## 2022-01-16 DIAGNOSIS — T148XXA Other injury of unspecified body region, initial encounter: Secondary | ICD-10-CM

## 2022-01-16 DIAGNOSIS — L089 Local infection of the skin and subcutaneous tissue, unspecified: Secondary | ICD-10-CM

## 2022-01-16 DIAGNOSIS — R110 Nausea: Secondary | ICD-10-CM

## 2022-01-16 MED ORDER — ONDANSETRON 4 MG PO TBDP: 4.0000 mg | ORAL_TABLET | Freq: Three times a day (TID) | ORAL | 0 refills | Status: AC | PRN

## 2022-01-16 MED ORDER — SULFAMETHOXAZOLE-TRIMETHOPRIM 800-160 MG PO TABS: 1.0000 | ORAL_TABLET | Freq: Two times a day (BID) | ORAL | 0 refills | Status: DC

## 2022-01-16 NOTE — Progress Notes (Signed)
Virtual Visit Consent   Maryanne Huneycutt, you are scheduled for a virtual visit with a [facility] provider today. Just as with appointments in the office, your consent must be obtained to participate. Your consent will be active for this visit and any virtual visit you may have with one of our providers in the next 365 days. If you have a MyChart account, a copy of this consent can be sent to you electronically.  As this is a virtual visit, video technology does not allow for your provider to perform a traditional examination. This may limit your provider's ability to fully assess your condition. If your provider identifies any concerns that need to be evaluated in person or the need to arrange testing (such as labs, EKG, etc.), we will make arrangements to do so. Although advances in technology are sophisticated, we cannot ensure that it will always work on either your end or our end. If the connection with a video visit is poor, the visit may have to be switched to a telephone visit. With either a video or telephone visit, we are not always able to ensure that we have a secure connection.  By engaging in this virtual visit, you consent to the provision of healthcare and authorize for your insurance to be billed (if applicable) for the services provided during this visit. Depending on your insurance coverage, you may receive a charge related to this service.  I need to obtain your verbal consent now. Are you willing to proceed with your visit today? Katherine Dunlap has provided verbal consent on 01/16/2022 for a virtual visit (video or telephone). Piedad Climes, [place]  Date: 01/16/2022 1:44 PM  Virtual Visit via Video Note   I, Piedad Climes, connected with  Katherine Dunlap  ([id number], 01-04-1996) on 01/16/22 at  1:30 PM EDT by a video-enabled telemedicine application and verified that I am speaking with the correct person using two identifiers.  Location: Patient: Virtual Visit Location Patient:  Home Provider: Virtual Visit Location Provider: Home Office   I discussed the limitations of evaluation and management by telemedicine and the availability of in person appointments. The patient expressed understanding and agreed to proceed.    History of Present Illness: Katherine Dunlap is a 26 y.o. who identifies as a female who was assigned female at birth, and is being seen today for poorly healing and possible infected cut of her R ankle sustained a week ago when she cut ankle on her chair. Notes trying to clean area with peroxide a few times daily and applying OTC ointment but area has not healed yet. Noting now redness and tenderness with warmth to the area. Denies fever or chills. Had some leftover Doxycycline from prior infection that she stopped due to nausea. Tried one tablet this morning but made her sick to her stomach.  HPI: HPI  Problems:  Patient Active Problem List   Diagnosis Date Noted   Severe depression (HCC) 05/11/2018   Migraine with aura and without status migrainosus, not intractable 07/24/2016   Environmental allergies 12/07/2015    Allergies:  Allergies  Allergen Reactions   Penicillins Hives and Itching    Did it involve swelling of the face/tongue/throat, SOB, or low BP? No Did it involve sudden or severe rash/hives, skin peeling, or any reaction on the inside of your mouth or nose? Yes Did you need to seek medical attention at a hospital or doctor's office? Yes When did it last happen?      April 2020  If all above answers are "NO", may proceed with cephalosporin use.    Doxycycline Nausea And Vomiting   Medications:  Current Outpatient Medications:    ondansetron (ZOFRAN-ODT) 4 MG disintegrating tablet, Take 1 tablet (4 mg total) by mouth every 8 (eight) hours as needed for nausea or vomiting., Disp: 20 tablet, Rfl: 0   fluticasone (FLONASE) 50 MCG/ACT nasal spray, Place 2 sprays into both nostrils daily., Disp: 9.9 mL, Rfl: 0   gabapentin (NEURONTIN) 300 MG  capsule, Take 300 mg by mouth 3 (three) times daily., Disp: , Rfl:    ketoconazole (NIZORAL) 2 % shampoo, Apply 1 application. topically 2 (two) times a week., Disp: 120 mL, Rfl: 0   loratadine (CLARITIN) 10 MG tablet, Take 10 mg by mouth daily., Disp: , Rfl:   Observations/Objective: Patient is well-developed, well-nourished in no acute distress.  Resting comfortably at home.  Head is normocephalic, atraumatic.  No labored breathing. Speech is clear and coherent with logical content.  Patient is alert and oriented at baseline.  R lateral ankle noted with a 1 cm in length superficial laceration without active drainage. Redness noted in circular area around the cut, about 2 cm in diameter.  Assessment and Plan: 1. Nausea - ondansetron (ZOFRAN-ODT) 4 MG disintegrating tablet; Take 1 tablet (4 mg total) by mouth every 8 (eight) hours as needed for nausea or vomiting.  Dispense: 20 tablet; Refill: 0  Secondary to intolerance to Doxycycline which she restarted today. Since only one dose in system, should quickly improve/resolve but encouraged sips of fluids and BRAT diet. Rx Zofran given.  2. Infected wound  With some delayed healing due to home wound care with peroxide use. Will have her stop this. Wound care reviewed with her in detail and instructions written out. For infection, giving allergies and intolerances, will start on Bactrim DS BID x 7 days. Strict in-person evaluation precautions reviewed.   Follow Up Instructions: I discussed the assessment and treatment plan with the patient. The patient was provided an opportunity to ask questions and all were answered. The patient agreed with the plan and demonstrated an understanding of the instructions.  A copy of instructions were sent to the patient via MyChart unless otherwise noted below.   The patient was advised to call back or seek an in-person evaluation if the symptoms worsen or if the condition fails to improve as  anticipated.  Time:  I spent 10 minutes with the patient via telehealth technology discussing the above problems/concerns.    Piedad Climes, PA-C

## 2022-01-16 NOTE — Patient Instructions (Signed)
  [name], thank you for joining Piedad Climes, PA-C for today's virtual visit.  While this provider is not your primary care provider (PCP), if your PCP is located in our provider database this encounter information will be shared with them immediately following your visit.  Consent: (Patient) Katherine Dunlap provided verbal consent for this virtual visit at the beginning of the encounter.  Current Medications:  Current Outpatient Medications:    ondansetron (ZOFRAN-ODT) 4 MG disintegrating tablet, Take 1 tablet (4 mg total) by mouth every 8 (eight) hours as needed for nausea or vomiting., Disp: 20 tablet, Rfl: 0   fluticasone (FLONASE) 50 MCG/ACT nasal spray, Place 2 sprays into both nostrils daily., Disp: 9.9 mL, Rfl: 0   gabapentin (NEURONTIN) 300 MG capsule, Take 300 mg by mouth 3 (three) times daily., Disp: , Rfl:    ketoconazole (NIZORAL) 2 % shampoo, Apply 1 application. topically 2 (two) times a week., Disp: 120 mL, Rfl: 0   loratadine (CLARITIN) 10 MG tablet, Take 10 mg by mouth daily., Disp: , Rfl:    Medications ordered in this encounter:  Meds ordered this encounter  Medications   ondansetron (ZOFRAN-ODT) 4 MG disintegrating tablet    Sig: Take 1 tablet (4 mg total) by mouth every 8 (eight) hours as needed for nausea or vomiting.    Dispense:  20 tablet    Refill:  0    Order Specific Question:   Supervising Provider    Answer:   Merrilee Jansky [id number]     *If you need refills on other medications prior to your next appointment, please contact your pharmacy*  Follow-Up: Call back or seek an in-person evaluation if the symptoms worsen or if the condition fails to improve as anticipated.  Other Instructions Please keep skin clean and dry. Remember to wash with a small amount of soap and plenty of clean water, patting completely dry. You can apply a very thin layer of vaseline or triple antibiotic ointment once daily. Keep covered if having to go out or where the  wound may be exposed to dirt, etc.  Take the antibiotic as directed.  The nausea today should resolve quickly. No more Doxycycline! Keep hydrated and follow dietary recommendations below for a bland diet. You can use the zofran as directed, if needed.  If symptoms are not resolving or you note any new or worsening symptoms despite treatment, please seek an in-person evaluation ASAP.   If you have been instructed to have an in-person evaluation today at a local Urgent Care facility, please use the link below. It will take you to a list of all of our available [facility], including address, phone number and hours of operation. Please do not delay care.  [facility]  If you or a family member do not have a primary care provider, use the link below to schedule a visit and establish care. When you choose a [facility] primary care physician or advanced practice provider, you gain a long-term partner in health. Find a Primary Care Provider  Learn more about [facility]'s in-office and virtual care options: [facility] - Get Care Now

## 2022-01-16 NOTE — Addendum Note (Signed)
Addended by: Waldon Merl on: 01/16/2022 02:23 PM   Modules accepted: Orders

## 2022-03-18 ENCOUNTER — Telehealth: Payer: 59 | Admitting: Family Medicine

## 2022-03-18 DIAGNOSIS — J069 Acute upper respiratory infection, unspecified: Secondary | ICD-10-CM | POA: Diagnosis not present

## 2022-03-18 MED ORDER — FLUTICASONE PROPIONATE 50 MCG/ACT NA SUSP: 2.0000 | Freq: Every day | NASAL | 0 refills | Status: DC

## 2022-03-18 MED ORDER — BENZONATATE 100 MG PO CAPS: 100.0000 mg | ORAL_CAPSULE | Freq: Three times a day (TID) | ORAL | 0 refills | Status: AC | PRN

## 2022-03-18 NOTE — Patient Instructions (Signed)
Katherine Dunlap, thank you for joining Katherine Finner, NP for today's virtual visit.  While this provider is not your primary care provider (PCP), if your PCP is located in our provider database this encounter information will be shared with them immediately following your visit.   A [name] account gives you access to today's visit and all your visits, tests, and labs performed at [facility] " click here if you don't have a [name] account or go to [email or web address]  Consent: (Patient) Katherine Dunlap provided verbal consent for this virtual visit at the beginning of the encounter.  Current Medications:  Current Outpatient Medications:    benzonatate (TESSALON) 100 MG capsule, Take 1 capsule (100 mg total) by mouth 3 (three) times daily as needed for cough., Disp: 30 capsule, Rfl: 0   fluticasone (FLONASE) 50 MCG/ACT nasal spray, Place 2 sprays into both nostrils daily., Disp: 16 g, Rfl: 0   gabapentin (NEURONTIN) 300 MG capsule, Take 300 mg by mouth 3 (three) times daily., Disp: , Rfl:    ketoconazole (NIZORAL) 2 % shampoo, Apply 1 application. topically 2 (two) times a week., Disp: 120 mL, Rfl: 0   loratadine (CLARITIN) 10 MG tablet, Take 10 mg by mouth daily., Disp: , Rfl:    ondansetron (ZOFRAN-ODT) 4 MG disintegrating tablet, Take 1 tablet (4 mg total) by mouth every 8 (eight) hours as needed for nausea or vomiting., Disp: 20 tablet, Rfl: 0   sulfamethoxazole-trimethoprim (BACTRIM DS) 800-160 MG tablet, Take 1 tablet by mouth 2 (two) times daily., Disp: 14 tablet, Rfl: 0   Medications ordered in this encounter:  Meds ordered this encounter  Medications   fluticasone (FLONASE) 50 MCG/ACT nasal spray    Sig: Place 2 sprays into both nostrils daily.    Dispense:  16 g    Refill:  0    Order Specific Question:   Supervising Provider    Answer:   Merrilee Jansky [[id number]]   benzonatate (TESSALON) 100 MG capsule    Sig: Take 1 capsule (100 mg total) by  mouth 3 (three) times daily as needed for cough.    Dispense:  30 capsule    Refill:  0    Order Specific Question:   Supervising Provider    Answer:   Merrilee Jansky [id number]     *If you need refills on other medications prior to your next appointment, please contact your pharmacy*  Follow-Up: Call back or seek an in-person evaluation if the symptoms worsen or if the condition fails to improve as anticipated.   Virtual Care [phone]  Other Instructions  -Take meds as prescribed -Rest -Use a cool mist humidifier especially during the winter months when heat dries out the air. - Use saline nose sprays frequently to help soothe nasal passages and promote drainage. -Saline irrigations of the nose can be very helpful if done frequently.             * 4X daily for 1 week*             * Use of a nettie pot can be helpful with this.  *Follow directions with this* *Boiled or distilled water only -stay hydrated by drinking plenty of fluids - Keep thermostat turn down low to prevent drying out sinuses - For any cough or congestion- robitussin DM or Delsym as needed - For fever or aches or pains- take tylenol or ibuprofen as directed on bottle             *  for fevers greater than 101 orally you may alternate ibuprofen and tylenol every 3 hours.  If you do not improve you will need a follow up visit in person.                 If you have been instructed to have an in-person evaluation today at a local Urgent Care facility, please use the link below. It will take you to a list of all of our available [facility], including address, phone number and hours of operation. Please do not delay care.  [facility]  If you or a family member do not have a primary care provider, use the link below to schedule a visit and establish care. When you choose a [facility] primary care physician or advanced practice provider, you gain a long-term partner in  health. Find a Primary Care Provider  Learn more about [facility]'s in-office and virtual care options:  - Get Care Now

## 2022-03-18 NOTE — Progress Notes (Signed)
Virtual Visit Consent   Katherine Dunlap, you are scheduled for a virtual visit with a [facility] provider today. Just as with appointments in the office, your consent must be obtained to participate. Your consent will be active for this visit and any virtual visit you may have with one of our providers in the next 365 days. If you have a MyChart account, a copy of this consent can be sent to you electronically.  As this is a virtual visit, video technology does not allow for your provider to perform a traditional examination. This may limit your provider's ability to fully assess your condition. If your provider identifies any concerns that need to be evaluated in person or the need to arrange testing (such as labs, EKG, etc.), we will make arrangements to do so. Although advances in technology are sophisticated, we cannot ensure that it will always work on either your end or our end. If the connection with a video visit is poor, the visit may have to be switched to a telephone visit. With either a video or telephone visit, we are not always able to ensure that we have a secure connection.  By engaging in this virtual visit, you consent to the provision of healthcare and authorize for your insurance to be billed (if applicable) for the services provided during this visit. Depending on your insurance coverage, you may receive a charge related to this service.  I need to obtain your verbal consent now. Are you willing to proceed with your visit today? Dimonique Bourdeau has provided verbal consent on 03/18/2022 for a virtual visit (video or telephone). [name], NP  Date: 03/18/2022 10:42 AM  Virtual Visit via Video Note   I, [name], connected with  Katherine Dunlap  ([id number], [birth year]) on 03/18/22 at 10:45 AM EST by a video-enabled telemedicine application and verified that I am speaking with the correct person using two identifiers.  Location: Patient: Virtual Visit Location Patient: Home Provider:  Virtual Visit Location Provider: Home Office   I discussed the limitations of evaluation and management by telemedicine and the availability of in person appointments. The patient expressed understanding and agreed to proceed.    History of Present Illness: Katherine Dunlap is a 26 y.o. who identifies as a female who was assigned female at birth, and is being seen today for sinus pressure and headache.  HPI: Sinus Problem This is a new problem. Episode onset: 3 days ago. The problem has been gradually worsening since onset. There has been no fever. Associated symptoms include congestion, coughing, headaches, sinus pressure and sneezing. Pertinent negatives include no diaphoresis, hoarse voice, neck pain, shortness of breath, sore throat or swollen glands. (Muffled hearing ) Past treatments include saline nose sprays. The treatment provided no relief.    Problems:  Patient Active Problem List   Diagnosis Date Noted   Severe depression (HCC) 05/11/2018   Migraine with aura and without status migrainosus, not intractable 07/24/2016   Environmental allergies 12/07/2015    Allergies:  Allergies  Allergen Reactions   Penicillins Hives and Itching    Did it involve swelling of the face/tongue/throat, SOB, or low BP? No Did it involve sudden or severe rash/hives, skin peeling, or any reaction on the inside of your mouth or nose? Yes Did you need to seek medical attention at a hospital or doctor's office? Yes When did it last happen?      April 2020 If all above answers are "NO", may proceed with cephalosporin use.  Doxycycline Nausea And Vomiting   Medications:  Current Outpatient Medications:    fluticasone (FLONASE) 50 MCG/ACT nasal spray, Place 2 sprays into both nostrils daily., Disp: 9.9 mL, Rfl: 0   gabapentin (NEURONTIN) 300 MG capsule, Take 300 mg by mouth 3 (three) times daily., Disp: , Rfl:    ketoconazole (NIZORAL) 2 % shampoo, Apply 1 application. topically 2 (two) times a week.,  Disp: 120 mL, Rfl: 0   loratadine (CLARITIN) 10 MG tablet, Take 10 mg by mouth daily., Disp: , Rfl:    ondansetron (ZOFRAN-ODT) 4 MG disintegrating tablet, Take 1 tablet (4 mg total) by mouth every 8 (eight) hours as needed for nausea or vomiting., Disp: 20 tablet, Rfl: 0   sulfamethoxazole-trimethoprim (BACTRIM DS) 800-160 MG tablet, Take 1 tablet by mouth 2 (two) times daily., Disp: 14 tablet, Rfl: 0  Observations/Objective: Patient is well-developed, well-nourished in no acute distress.  Resting comfortably  at home.  Head is normocephalic, atraumatic.  No labored breathing.  Speech is clear and coherent with logical content.  Patient is alert and oriented at baseline.   Assessment and Plan: 1. Viral URI with cough  - fluticasone (FLONASE) 50 MCG/ACT nasal spray; Place 2 sprays into both nostrils daily.  Dispense: 16 g; Refill: 0 - benzonatate (TESSALON) 100 MG capsule; Take 1 capsule (100 mg total) by mouth 3 (three) times daily as needed for cough.  Dispense: 30 capsule; Refill: 0  -Take meds as prescribed -Rest -Use a cool mist humidifier especially during the winter months when heat dries out the air. - Use saline nose sprays frequently to help soothe nasal passages and promote drainage. -Saline irrigations of the nose can be very helpful if done frequently.             * 4X daily for 1 week*             * Use of a nettie pot can be helpful with this.  *Follow directions with this* *Boiled or distilled water only -stay hydrated by drinking plenty of fluids - Keep thermostat turn down low to prevent drying out sinuses - For any cough or congestion- robitussin DM or Delsym as needed - For fever or aches or pains- take tylenol or ibuprofen as directed on bottle             * for fevers greater than 101 orally you may alternate ibuprofen and tylenol every 3 hours.  If you do not improve you will need a follow up visit in person.               Reviewed side effects, risks and  benefits of medication.    Patient acknowledged agreement and understanding of the plan.   Past Medical, Surgical, Social History, Allergies, and Medications have been Reviewed.    Follow Up Instructions: I discussed the assessment and treatment plan with the patient. The patient was provided an opportunity to ask questions and all were answered. The patient agreed with the plan and demonstrated an understanding of the instructions.  A copy of instructions were sent to the patient via MyChart unless otherwise noted below.     The patient was advised to call back or seek an in-person evaluation if the symptoms worsen or if the condition fails to improve as anticipated.  Time:  I spent 7 minutes with the patient via telehealth technology discussing the above problems/concerns.    [name], NP

## 2022-03-21 ENCOUNTER — Telehealth: Payer: 59 | Admitting: Family Medicine

## 2022-03-21 DIAGNOSIS — B379 Candidiasis, unspecified: Secondary | ICD-10-CM | POA: Diagnosis not present

## 2022-03-21 DIAGNOSIS — J019 Acute sinusitis, unspecified: Secondary | ICD-10-CM | POA: Diagnosis not present

## 2022-03-21 DIAGNOSIS — B9689 Other specified bacterial agents as the cause of diseases classified elsewhere: Secondary | ICD-10-CM | POA: Diagnosis not present

## 2022-03-21 DIAGNOSIS — T3695XA Adverse effect of unspecified systemic antibiotic, initial encounter: Secondary | ICD-10-CM

## 2022-03-21 MED ORDER — FLUCONAZOLE 150 MG PO TABS: 150.0000 mg | ORAL_TABLET | Freq: Every day | ORAL | 0 refills | Status: DC

## 2022-03-21 MED ORDER — AZITHROMYCIN 250 MG PO TABS: ORAL_TABLET | ORAL | 0 refills | Status: AC

## 2022-03-21 MED ORDER — PREDNISONE 20 MG PO TABS: 40.0000 mg | ORAL_TABLET | Freq: Every day | ORAL | 0 refills | Status: AC

## 2022-03-21 NOTE — Progress Notes (Signed)
Virtual Visit Consent   Katherine Dunlap, you are scheduled for a virtual visit with a [facility] provider today. Just as with appointments in the office, your consent must be obtained to participate. Your consent will be active for this visit and any virtual visit you may have with one of our providers in the next 365 days. If you have a MyChart account, a copy of this consent can be sent to you electronically.  As this is a virtual visit, video technology does not allow for your provider to perform a traditional examination. This may limit your provider's ability to fully assess your condition. If your provider identifies any concerns that need to be evaluated in person or the need to arrange testing (such as labs, EKG, etc.), we will make arrangements to do so. Although advances in technology are sophisticated, we cannot ensure that it will always work on either your end or our end. If the connection with a video visit is poor, the visit may have to be switched to a telephone visit. With either a video or telephone visit, we are not always able to ensure that we have a secure connection.  By engaging in this virtual visit, you consent to the provision of healthcare and authorize for your insurance to be billed (if applicable) for the services provided during this visit. Depending on your insurance coverage, you may receive a charge related to this service.  I need to obtain your verbal consent now. Are you willing to proceed with your visit today? [name] has provided verbal consent on 03/21/2022 for a virtual visit (video or telephone). [name], NP  Date: 03/21/2022 10:17 AM  Virtual Visit via Video Note   I, [name], connected with  Mckenzey Parcell  ([id number], [birth year]) on 03/21/22 at 10:15 AM EST by a video-enabled telemedicine application and verified that I am speaking with the correct person using two identifiers.  Location: Patient: Virtual Visit Location Patient: Home Provider:  Virtual Visit Location Provider: Home Office   I discussed the limitations of evaluation and management by telemedicine and the availability of in person appointments. The patient expressed understanding and agreed to proceed.    History of Present Illness: Katherine Dunlap is a 26 y.o. who identifies as a female who was assigned female at birth, and is being seen today for ongoing sinus congestion, ear muffled sound, and sinus pressure and headache. Denies fevers, chills, chest pain, shortness of breath, sore throat. Has been trying saline spray.   Was seen on Monday for 3 days of viral infection, that has worsen in symptoms. History of sinus infections.  Problems:  Patient Active Problem List   Diagnosis Date Noted   Severe depression (HCC) 05/11/2018   Migraine with aura and without status migrainosus, not intractable 07/24/2016   Environmental allergies 12/07/2015    Allergies:  Allergies  Allergen Reactions   Penicillins Hives and Itching    Did it involve swelling of the face/tongue/throat, SOB, or low BP? No Did it involve sudden or severe rash/hives, skin peeling, or any reaction on the inside of your mouth or nose? Yes Did you need to seek medical attention at a hospital or doctor's office? Yes When did it last happen?      April 2020 If all above answers are "NO", may proceed with cephalosporin use.    Doxycycline Nausea And Vomiting   Medications:  Current Outpatient Medications:    benzonatate (TESSALON) 100 MG capsule, Take 1 capsule (100 mg  total) by mouth 3 (three) times daily as needed for cough., Disp: 30 capsule, Rfl: 0   fluticasone (FLONASE) 50 MCG/ACT nasal spray, Place 2 sprays into both nostrils daily., Disp: 16 g, Rfl: 0   gabapentin (NEURONTIN) 300 MG capsule, Take 300 mg by mouth 3 (three) times daily., Disp: , Rfl:    ketoconazole (NIZORAL) 2 % shampoo, Apply 1 application. topically 2 (two) times a week., Disp: 120 mL, Rfl: 0   loratadine (CLARITIN) 10 MG  tablet, Take 10 mg by mouth daily., Disp: , Rfl:    ondansetron (ZOFRAN-ODT) 4 MG disintegrating tablet, Take 1 tablet (4 mg total) by mouth every 8 (eight) hours as needed for nausea or vomiting., Disp: 20 tablet, Rfl: 0   sulfamethoxazole-trimethoprim (BACTRIM DS) 800-160 MG tablet, Take 1 tablet by mouth 2 (two) times daily., Disp: 14 tablet, Rfl: 0  Observations/Objective: Patient is well-developed, well-nourished in no acute distress.  Resting comfortably  at home.  Head is normocephalic, atraumatic.  enderness to touch of frontal and max sinus- pt performed during video visit  No labored breathing.  Speech is clear and coherent with logical content.  Patient is alert and oriented at baseline.  Nasal tone noted  Assessment and Plan:  1. Acute bacterial sinusitis  - predniSONE (DELTASONE) 20 MG tablet; Take 2 tablets (40 mg total) by mouth daily with breakfast for 3 days.  Dispense: 6 tablet; Refill: 0 - azithromycin (ZITHROMAX) 250 MG tablet; Take 2 tablets on day 1, then 1 tablet daily on days 2 through 5  Dispense: 6 tablet; Refill: 0  2. Antibiotic-induced yeast infection  - fluconazole (DIFLUCAN) 150 MG tablet; Take 1 tablet (150 mg total) by mouth daily. May repeat in 3 days  Dispense: 2 tablet; Refill: 0  S&S are consistent with sinus infection -OTC measures reviewed -Zpack, pred burst (to help with inflammation), and yeast medication post Abx. -continue flonase, look at switching from claritin to xyzal. -Take meds as prescribed -Rest -Use a cool mist humidifier especially during the winter months when heat dries out the air. - Use saline nose sprays frequently to help soothe nasal passages and promote drainage. -Saline irrigations of the nose can be very helpful if done frequently.             * 4X daily for 1 week*             * Use of a nettie pot can be helpful with this.  *Follow directions with this* *Boiled or distilled water only -stay hydrated by drinking  plenty of fluids - Keep thermostat turn down low to prevent drying out sinuses - For any cough or congestion- robitussin DM or Delsym as needed - For fever or aches or pains- take tylenol or ibuprofen as directed on bottle             * for fevers greater than 101 orally you may alternate ibuprofen and tylenol every 3 hours.  If you do not improve you will need a follow up visit in person.                Reviewed side effects, risks and benefits of medication.    Patient acknowledged agreement and understanding of the plan.   Past Medical, Surgical, Social History, Allergies, and Medications have been Reviewed.    Follow Up Instructions: I discussed the assessment and treatment plan with the patient. The patient was provided an opportunity to ask questions and all were answered. The patient agreed  with the plan and demonstrated an understanding of the instructions.  A copy of instructions were sent to the patient via MyChart unless otherwise noted below.     The patient was advised to call back or seek an in-person evaluation if the symptoms worsen or if the condition fails to improve as anticipated.  Time:  I spent 10 minutes with the patient via telehealth technology discussing the above problems/concerns.    Perlie Mayo, NP

## 2022-03-21 NOTE — Patient Instructions (Signed)
[name], thank you for joining [name], NP for today's virtual visit.  While this provider is not your primary care provider (PCP), if your PCP is located in our provider database this encounter information will be shared with them immediately following your visit.   A [facility] MyChart account gives you access to today's visit and all your visits, tests, and labs performed at [facility] " click here if you don't have a [facility] MyChart account or go to [email or web address]  Consent: (Patient) Lareta Bruneau provided verbal consent for this virtual visit at the beginning of the encounter.  Current Medications:  Current Outpatient Medications:    azithromycin (ZITHROMAX) 250 MG tablet, Take 2 tablets on day 1, then 1 tablet daily on days 2 through 5, Disp: 6 tablet, Rfl: 0   fluconazole (DIFLUCAN) 150 MG tablet, Take 1 tablet (150 mg total) by mouth daily. May repeat in 3 days, Disp: 2 tablet, Rfl: 0   predniSONE (DELTASONE) 20 MG tablet, Take 2 tablets (40 mg total) by mouth daily with breakfast for 3 days., Disp: 6 tablet, Rfl: 0   benzonatate (TESSALON) 100 MG capsule, Take 1 capsule (100 mg total) by mouth 3 (three) times daily as needed for cough., Disp: 30 capsule, Rfl: 0   fluticasone (FLONASE) 50 MCG/ACT nasal spray, Place 2 sprays into both nostrils daily., Disp: 16 g, Rfl: 0   gabapentin (NEURONTIN) 300 MG capsule, Take 300 mg by mouth 3 (three) times daily., Disp: , Rfl:    ketoconazole (NIZORAL) 2 % shampoo, Apply 1 application. topically 2 (two) times a week., Disp: 120 mL, Rfl: 0   loratadine (CLARITIN) 10 MG tablet, Take 10 mg by mouth daily., Disp: , Rfl:    ondansetron (ZOFRAN-ODT) 4 MG disintegrating tablet, Take 1 tablet (4 mg total) by mouth every 8 (eight) hours as needed for nausea or vomiting., Disp: 20 tablet, Rfl: 0   sulfamethoxazole-trimethoprim (BACTRIM DS) 800-160 MG tablet, Take 1 tablet by mouth 2 (two) times daily., Disp: 14 tablet, Rfl:  0   Medications ordered in this encounter:  Meds ordered this encounter  Medications   predniSONE (DELTASONE) 20 MG tablet    Sig: Take 2 tablets (40 mg total) by mouth daily with breakfast for 3 days.    Dispense:  6 tablet    Refill:  0    Order Specific Question:   Supervising Provider    Answer:   Merrilee Jansky [[id number]]   azithromycin (ZITHROMAX) 250 MG tablet    Sig: Take 2 tablets on day 1, then 1 tablet daily on days 2 through 5    Dispense:  6 tablet    Refill:  0    Order Specific Question:   Supervising Provider    Answer:   Merrilee Jansky [[id number]]   fluconazole (DIFLUCAN) 150 MG tablet    Sig: Take 1 tablet (150 mg total) by mouth daily. May repeat in 3 days    Dispense:  2 tablet    Refill:  0    Order Specific Question:   Supervising Provider    Answer:   Merrilee Jansky [[id number]]     *If you need refills on other medications prior to your next appointment, please contact your pharmacy*  Follow-Up: Call back or seek an in-person evaluation if the symptoms worsen or if the condition fails to improve as anticipated.  [facility] [phone]  Other Instructions  Sinus Infection, Adult A sinus infection is  soreness and swelling (inflammation) of your sinuses. Sinuses are hollow spaces in the bones around your face. They are located: Around your eyes. In the middle of your forehead. Behind your nose. In your cheekbones. Your sinuses and nasal passages are lined with a fluid called mucus. Mucus drains out of your sinuses. Swelling can trap mucus in your sinuses. This lets germs (bacteria, virus, or fungus) grow, which leads to infection. Most of the time, this condition is caused by a virus. What are the causes? Allergies. Asthma. Germs. Things that block your nose or sinuses. Growths in the nose (nasal polyps). Chemicals or irritants in the air. A fungus. This is rare. What increases the risk? Having a weak body defense system  (immune system). Doing a lot of swimming or diving. Using nasal sprays too much. Smoking. What are the signs or symptoms? The main symptoms of this condition are pain and a feeling of pressure around the sinuses. Other symptoms include: Stuffy nose (congestion). This may make it hard to breathe through your nose. Runny nose (drainage). Soreness, swelling, and warmth in the sinuses. A cough that may get worse at night. Being unable to smell and taste. Mucus that collects in the throat or the back of the nose (postnasal drip). This may cause a sore throat or bad breath. Being very tired (fatigued). A fever. How is this diagnosed? Your symptoms. Your medical history. A physical exam. Tests to find out if your condition is short-term (acute) or long-term (chronic). Your doctor may: Check your nose for growths (polyps). Check your sinuses using a tool that has a light on one end (endoscope). Check for allergies or germs. Do imaging tests, such as an MRI or CT scan. How is this treated? Treatment for this condition depends on the cause and whether it is short-term or long-term. If caused by a virus, your symptoms should go away on their own within 10 days. You may be given medicines to relieve symptoms. They include: Medicines that shrink swollen tissue in the nose. A spray that treats swelling of the nostrils. Rinses that help get rid of thick mucus in your nose (nasal saline washes). Medicines that treat allergies (antihistamines). Over-the-counter pain relievers. If caused by bacteria, your doctor may wait to see if you will get better without treatment. You may be given antibiotic medicine if you have: A very bad infection. A weak body defense system. If caused by growths in the nose, surgery may be needed. Follow these instructions at home: Medicines Take, use, or apply over-the-counter and prescription medicines only as told by your doctor. These may include nasal sprays. If you  were prescribed an antibiotic medicine, take it as told by your doctor. Do not stop taking it even if you start to feel better. Hydrate and humidify  Drink enough water to keep your pee (urine) pale yellow. Use a cool mist humidifier to keep the humidity level in your home above 50%. Breathe in steam for 10-15 minutes, 3-4 times a day, or as told by your doctor. You can do this in the bathroom while a hot shower is running. Try not to spend time in cool or dry air. Rest Rest as much as you can. Sleep with your head raised (elevated). Make sure you get enough sleep each night. General instructions  Put a warm, moist washcloth on your face 3-4 times a day, or as often as told by your doctor. Use nasal saline washes as often as told by your doctor. Wash your  hands often with soap and water. If you cannot use soap and water, use hand sanitizer. Do not smoke. Avoid being around people who are smoking (secondhand smoke). Keep all follow-up visits. Contact a doctor if: You have a fever. Your symptoms get worse. Your symptoms do not get better within 10 days. Get help right away if: You have a very bad headache. You cannot stop vomiting. You have very bad pain or swelling around your face or eyes. You have trouble seeing. You feel confused. Your neck is stiff. You have trouble breathing. These symptoms may be an emergency. Get help right away. Call 911. Do not wait to see if the symptoms will go away. Do not drive yourself to the hospital. Summary A sinus infection is swelling of your sinuses. Sinuses are hollow spaces in the bones around your face. This condition is caused by tissues in your nose that become inflamed or swollen. This traps germs. These can lead to infection. If you were prescribed an antibiotic medicine, take it as told by your doctor. Do not stop taking it even if you start to feel better. Keep all follow-up visits. This information is not intended to replace advice  given to you by your health care provider. Make sure you discuss any questions you have with your health care provider. Document Revised: 03/26/2021 Document Reviewed: 03/26/2021 Elsevier Patient Education  [place].    If you have been instructed to have an in-person evaluation today at a local Urgent Care facility, please use the link below. It will take you to a list of all of our available [facility], including address, phone number and hours of operation. Please do not delay care.  [facility]  If you or a family member do not have a primary care provider, use the link below to schedule a visit and establish care. When you choose a [facility] primary care physician or advanced practice provider, you gain a long-term partner in health. Find a Primary Care Provider  Learn more about [facility]'s in-office and virtual care options: [facility]

## 2022-07-17 ENCOUNTER — Telehealth: Payer: No Typology Code available for payment source | Admitting: Family Medicine

## 2022-07-17 DIAGNOSIS — B379 Candidiasis, unspecified: Secondary | ICD-10-CM | POA: Diagnosis not present

## 2022-07-17 DIAGNOSIS — T3695XA Adverse effect of unspecified systemic antibiotic, initial encounter: Secondary | ICD-10-CM

## 2022-07-17 DIAGNOSIS — J301 Allergic rhinitis due to pollen: Secondary | ICD-10-CM

## 2022-07-17 MED ORDER — FLUCONAZOLE 150 MG PO TABS: 150.0000 mg | ORAL_TABLET | Freq: Every day | ORAL | 0 refills | Status: DC

## 2022-07-17 MED ORDER — FLUTICASONE PROPIONATE 50 MCG/ACT NA SUSP: 2.0000 | Freq: Every day | NASAL | 0 refills | Status: DC

## 2022-07-17 NOTE — Progress Notes (Signed)
Virtual Visit Consent   Katherine Dunlap, you are scheduled for a virtual visit with a [facility] provider today. Just as with appointments in the office, your consent must be obtained to participate. Your consent will be active for this visit and any virtual visit you may have with one of our providers in the next 365 days. If you have a MyChart account, a copy of this consent can be sent to you electronically.  As this is a virtual visit, video technology does not allow for your provider to perform a traditional examination. This may limit your provider's ability to fully assess your condition. If your provider identifies any concerns that need to be evaluated in person or the need to arrange testing (such as labs, EKG, etc.), we will make arrangements to do so. Although advances in technology are sophisticated, we cannot ensure that it will always work on either your end or our end. If the connection with a video visit is poor, the visit may have to be switched to a telephone visit. With either a video or telephone visit, we are not always able to ensure that we have a secure connection.  By engaging in this virtual visit, you consent to the provision of healthcare and authorize for your insurance to be billed (if applicable) for the services provided during this visit. Depending on your insurance coverage, you may receive a charge related to this service.  I need to obtain your verbal consent now. Are you willing to proceed with your visit today? Katherine Dunlap has provided verbal consent on 07/17/2022 for a virtual visit (video or telephone). Perlie Mayo, NP  Date: 07/17/2022 3:19 PM  Virtual Visit via Video Note   I, Perlie Mayo, connected with  Katherine Dunlap  ([id number], 1995/12/19) on 07/17/22 at  4:00 PM EDT by a video-enabled telemedicine application and verified that I am speaking with the correct person using two identifiers.  Location: Patient: Virtual Visit Location Patient: Home Provider:  Virtual Visit Location Provider: Home Office   I discussed the limitations of evaluation and management by telemedicine and the availability of in person appointments. The patient expressed understanding and agreed to proceed.    History of Present Illness: Katherine Dunlap is a 27 y.o. who identifies as a female who was assigned female at birth, and is being seen today for vaginal discharge.  Onset was last 2 days vaginal discharge after completing the full course of clindamycin post wisdom tooth removal Associated symptoms are itching Modifying factors are Denies chest pain, shortness of breath, fevers, chills, pelvic pain, odor, UTI symptoms or in urine.    Problems:  Patient Active Problem List   Diagnosis Date Noted   Severe depression ([place]) 05/11/2018   Migraine with aura and without status migrainosus, not intractable 07/24/2016   Environmental allergies 12/07/2015    Allergies:  Allergies  Allergen Reactions   Penicillins Hives and Itching    Did it involve swelling of the face/tongue/throat, SOB, or low BP? No Did it involve sudden or severe rash/hives, skin peeling, or any reaction on the inside of your mouth or nose? Yes Did you need to seek medical attention at a hospital or doctor's office? Yes When did it last happen?      April 2020 If all above answers are "NO", may proceed with cephalosporin use.    Doxycycline Nausea And Vomiting   Medications:  Current Outpatient Medications:    benzonatate (TESSALON) 100 MG capsule, Take 1 capsule (100 mg  total) by mouth 3 (three) times daily as needed for cough., Disp: 30 capsule, Rfl: 0   fluconazole (DIFLUCAN) 150 MG tablet, Take 1 tablet (150 mg total) by mouth daily. May repeat in 3 days, Disp: 2 tablet, Rfl: 0   fluticasone (FLONASE) 50 MCG/ACT nasal spray, Place 2 sprays into both nostrils daily., Disp: 16 g, Rfl: 0   gabapentin (NEURONTIN) 300 MG capsule, Take 300 mg by mouth 3 (three) times daily., Disp: , Rfl:     ketoconazole (NIZORAL) 2 % shampoo, Apply 1 application. topically 2 (two) times a week., Disp: 120 mL, Rfl: 0   loratadine (CLARITIN) 10 MG tablet, Take 10 mg by mouth daily., Disp: , Rfl:    ondansetron (ZOFRAN-ODT) 4 MG disintegrating tablet, Take 1 tablet (4 mg total) by mouth every 8 (eight) hours as needed for nausea or vomiting., Disp: 20 tablet, Rfl: 0   sulfamethoxazole-trimethoprim (BACTRIM DS) 800-160 MG tablet, Take 1 tablet by mouth 2 (two) times daily., Disp: 14 tablet, Rfl: 0  Observations/Objective: Patient is well-developed, well-nourished in no acute distress.  Resting comfortably  at home.  Head is normocephalic, atraumatic.  No labored breathing.  Speech is clear and coherent with logical content.  Patient is alert and oriented at baseline.    Assessment and Plan:   1. Antibiotic-induced yeast infection  Recent use of ABX for wisdom teeth being pulled.   - fluconazole (DIFLUCAN) 150 MG tablet; Take 1 tablet (150 mg total) by mouth daily. May repeat in 3 days  Dispense: 2 tablet; Refill: 0  2. Seasonal allergic rhinitis due to pollen  Refill requested due to allergy season  - fluticasone (FLONASE) 50 MCG/ACT nasal spray; Place 2 sprays into both nostrils daily.  Dispense: 16 g; Refill: 0   Reviewed side effects, risks and benefits of medication.    Patient acknowledged agreement and understanding of the plan.   Past Medical, Surgical, Social History, Allergies, and Medications have been Reviewed.    Follow Up Instructions: I discussed the assessment and treatment plan with the patient. The patient was provided an opportunity to ask questions and all were answered. The patient agreed with the plan and demonstrated an understanding of the instructions.  A copy of instructions were sent to the patient via MyChart unless otherwise noted below.    The patient was advised to call back or seek an in-person evaluation if the symptoms worsen or if the condition  fails to improve as anticipated.  Time:  I spent 10 minutes with the patient via telehealth technology discussing the above problems/concerns.    Perlie Mayo, NP

## 2022-07-17 NOTE — Patient Instructions (Signed)
Jens Som, thank you for joining Perlie Mayo, NP for today's virtual visit.  While this provider is not your primary care provider (PCP), if your PCP is located in our provider database this encounter information will be shared with them immediately following your visit.   [facility] account gives you access to today's visit and all your visits, tests, and labs performed at [facility] " click here if you don't have a [facility] account or go to [email or web address]  Consent: (Patient) Katherine Dunlap provided verbal consent for this virtual visit at the beginning of the encounter.  Current Medications:  Current Outpatient Medications:    benzonatate (TESSALON) 100 MG capsule, Take 1 capsule (100 mg total) by mouth 3 (three) times daily as needed for cough., Disp: 30 capsule, Rfl: 0   fluconazole (DIFLUCAN) 150 MG tablet, Take 1 tablet (150 mg total) by mouth daily. May repeat in 3 days, Disp: 2 tablet, Rfl: 0   fluticasone (FLONASE) 50 MCG/ACT nasal spray, Place 2 sprays into both nostrils daily., Disp: 16 g, Rfl: 0   gabapentin (NEURONTIN) 300 MG capsule, Take 300 mg by mouth 3 (three) times daily., Disp: , Rfl:    ketoconazole (NIZORAL) 2 % shampoo, Apply 1 application. topically 2 (two) times a week., Disp: 120 mL, Rfl: 0   loratadine (CLARITIN) 10 MG tablet, Take 10 mg by mouth daily., Disp: , Rfl:    ondansetron (ZOFRAN-ODT) 4 MG disintegrating tablet, Take 1 tablet (4 mg total) by mouth every 8 (eight) hours as needed for nausea or vomiting., Disp: 20 tablet, Rfl: 0   sulfamethoxazole-trimethoprim (BACTRIM DS) 800-160 MG tablet, Take 1 tablet by mouth 2 (two) times daily., Disp: 14 tablet, Rfl: 0   Medications ordered in this encounter:  Meds ordered this encounter  Medications   fluconazole (DIFLUCAN) 150 MG tablet    Sig: Take 1 tablet (150 mg total) by mouth daily. May repeat in 3 days    Dispense:  2 tablet    Refill:  0    Order Specific  Question:   Supervising Provider    Answer:   Chase Picket [id number]   fluticasone (FLONASE) 50 MCG/ACT nasal spray    Sig: Place 2 sprays into both nostrils daily.    Dispense:  16 g    Refill:  0    Order Specific Question:   Supervising Provider    Answer:   Chase Picket [id number]     *If you need refills on other medications prior to your next appointment, please contact your pharmacy*  Follow-Up: Call back or seek an in-person evaluation if the symptoms worsen or if the condition fails to improve as anticipated.  [facility] [phone]  Other Instructions Vaginal Yeast Infection, Adult  Vaginal yeast infection is a condition that causes vaginal discharge as well as soreness, swelling, and redness (inflammation) of the vagina. This is a common condition. Some women get this infection frequently. What are the causes? This condition is caused by a change in the normal balance of the yeast (Candida) and normal bacteria that live in the vagina. This change causes an overgrowth of yeast, which causes the inflammation. What increases the risk? The condition is more likely to develop in women who: Take antibiotic medicines. Have diabetes. Take birth control pills. Are pregnant. Douche often. Have a weak body defense system (immune system). Have been taking steroid medicines for a long time. Frequently wear tight clothing. What are the  signs or symptoms? Symptoms of this condition include: White, thick, creamy vaginal discharge. Swelling, itching, redness, and irritation of the vagina. The lips of the vagina (labia) may be affected as well. Pain or a burning feeling while urinating. Pain during sex. How is this diagnosed? This condition is diagnosed based on: Your medical history. A physical exam. A pelvic exam. Your health care provider will examine a sample of your vaginal discharge under a microscope. Your health care provider may send this sample  for testing to confirm the diagnosis. How is this treated? This condition is treated with medicine. Medicines may be over-the-counter or prescription. You may be told to use one or more of the following: Medicine that is taken by mouth (orally). Medicine that is applied as a cream (topically). Medicine that is inserted directly into the vagina (suppository). Follow these instructions at home: Take or apply over-the-counter and prescription medicines only as told by your health care provider. Do not use tampons until your health care provider approves. Do not have sex until your infection has cleared. Sex can prolong or worsen your symptoms of infection. Ask your health care provider when it is safe to resume sexual activity. Keep all follow-up visits. This is important. How is this prevented?  Do not wear tight clothes, such as pantyhose or tight pants. Wear breathable cotton underwear. Do not use douches, perfumed soap, creams, or powders. Wipe from front to back after using the toilet. If you have diabetes, keep your blood sugar levels under control. Ask your health care provider for other ways to prevent yeast infections. Contact a health care provider if: You have a fever. Your symptoms go away and then return. Your symptoms do not get better with treatment. Your symptoms get worse. You have new symptoms. You develop blisters in or around your vagina. You have blood coming from your vagina and it is not your menstrual period. You develop pain in your abdomen. Summary Vaginal yeast infection is a condition that causes discharge as well as soreness, swelling, and redness (inflammation) of the vagina. This condition is treated with medicine. Medicines may be over-the-counter or prescription. Take or apply over-the-counter and prescription medicines only as told by your health care provider. Do not douche. Resume sexual activity or use of tampons as instructed by your health care  provider. Contact a health care provider if your symptoms do not get better with treatment or your symptoms go away and then return. This information is not intended to replace advice given to you by your health care provider. Make sure you discuss any questions you have with your health care provider. Document Revised: 07/09/2020 Document Reviewed: 07/09/2020 Elsevier Patient Education  [place].   If you have been instructed to have an in-person evaluation today at a local Urgent Care facility, please use the link below. It will take you to a list of all of our available [facility], including address, phone number and hours of operation. Please do not delay care.  [facility]  If you or a family member do not have a primary care provider, use the link below to schedule a visit and establish care. When you choose a [facility] primary care physician or advanced practice provider, you gain a long-term partner in health. Find a Primary Care Provider  Learn more about [facility]'s in-office and virtual care options: [facility]

## 2022-09-08 ENCOUNTER — Telehealth: Payer: No Typology Code available for payment source | Admitting: Physician Assistant

## 2022-09-08 DIAGNOSIS — T3695XA Adverse effect of unspecified systemic antibiotic, initial encounter: Secondary | ICD-10-CM | POA: Diagnosis not present

## 2022-09-08 DIAGNOSIS — B379 Candidiasis, unspecified: Secondary | ICD-10-CM | POA: Diagnosis not present

## 2022-09-08 DIAGNOSIS — H66002 Acute suppurative otitis media without spontaneous rupture of ear drum, left ear: Secondary | ICD-10-CM

## 2022-09-08 MED ORDER — FLUTICASONE PROPIONATE 50 MCG/ACT NA SUSP
2.0000 | Freq: Every day | NASAL | 0 refills | Status: AC
Start: 2022-09-08 — End: ?

## 2022-09-08 MED ORDER — NEOMYCIN-POLYMYXIN-HC 3.5-10000-1 OT SOLN: 3.0000 [drp] | Freq: Four times a day (QID) | OTIC | 0 refills | Status: AC

## 2022-09-08 MED ORDER — AZITHROMYCIN 250 MG PO TABS: ORAL_TABLET | ORAL | 0 refills | Status: AC

## 2022-09-08 MED ORDER — FLUCONAZOLE 150 MG PO TABS: 150.0000 mg | ORAL_TABLET | ORAL | 0 refills | Status: DC | PRN

## 2022-09-08 NOTE — Progress Notes (Signed)
Virtual Visit Consent   Katherine Dunlap, you are scheduled for a virtual visit with a [facility] provider today. Just as with appointments in the office, your consent must be obtained to participate. Your consent will be active for this visit and any virtual visit you may have with one of our providers in the next 365 days. If you have a MyChart account, a copy of this consent can be sent to you electronically.  As this is a virtual visit, video technology does not allow for your provider to perform a traditional examination. This may limit your provider's ability to fully assess your condition. If your provider identifies any concerns that need to be evaluated in person or the need to arrange testing (such as labs, EKG, etc.), we will make arrangements to do so. Although advances in technology are sophisticated, we cannot ensure that it will always work on either your end or our end. If the connection with a video visit is poor, the visit may have to be switched to a telephone visit. With either a video or telephone visit, we are not always able to ensure that we have a secure connection.  By engaging in this virtual visit, you consent to the provision of healthcare and authorize for your insurance to be billed (if applicable) for the services provided during this visit. Depending on your insurance coverage, you may receive a charge related to this service.  I need to obtain your verbal consent now. Are you willing to proceed with your visit today? Katherine Dunlap has provided verbal consent on 09/08/2022 for a virtual visit (video or telephone). Katherine Loveless, PA-C  Date: 09/08/2022 10:26 AM  Virtual Visit via Video Note   I, Katherine Dunlap, connected with  Katherine Dunlap  ([id number], Oct 14, 1995) on 09/08/22 at 10:15 AM EDT by a video-enabled telemedicine application and verified that I am speaking with the correct person using two identifiers.  Location: Patient: Virtual Visit Location Patient:  Home Provider: Virtual Visit Location Provider: Home Office   I discussed the limitations of evaluation and management by telemedicine and the availability of in person appointments. The patient expressed understanding and agreed to proceed.    History of Present Illness: Katherine Dunlap is a 27 y.o. who identifies as a female who was assigned female at birth, and is being seen today for ear pain and sinus congestion.  HPI: Otalgia  There is pain in the left ear. This is a new problem. The current episode started in the past 7 days (Wednesday, 09/03/22). The problem occurs constantly. The problem has been gradually worsening. There has been no fever. The pain is moderate. Associated symptoms include coughing (dry; feels from post nasal drainage), headaches, hearing loss (muffled hearing, denies tinnitus), rhinorrhea and a sore throat (last week). Pertinent negatives include no ear discharge. Treatments tried: hot teas, decongestants, ibuprofen, ear ache drops. The treatment provided no relief. There is no history of a chronic ear infection, hearing loss or a tympanostomy tube.     Problems:  Patient Active Problem List   Diagnosis Date Noted   Severe depression ([facility]) 05/11/2018   Migraine with aura and without status migrainosus, not intractable 07/24/2016   Environmental allergies 12/07/2015    Allergies:  Allergies  Allergen Reactions   Penicillins Hives and Itching    Did it involve swelling of the face/tongue/throat, SOB, or low BP? No Did it involve sudden or severe rash/hives, skin peeling, or any reaction on the inside of your mouth or  nose? Yes Did you need to seek medical attention at a hospital or doctor's office? Yes When did it last happen?      April 2020 If all above answers are "NO", may proceed with cephalosporin use.    Doxycycline Nausea And Vomiting   Medications:  Current Outpatient Medications:    azithromycin (ZITHROMAX) 250 MG tablet, Take 2 tablets on day 1, then 1  tablet daily on days 2 through 5, Disp: 6 tablet, Rfl: 0   fluconazole (DIFLUCAN) 150 MG tablet, Take 1 tablet (150 mg total) by mouth every 3 (three) days as needed., Disp: 2 tablet, Rfl: 0   fluticasone (FLONASE) 50 MCG/ACT nasal spray, Place 2 sprays into both nostrils daily., Disp: 16 g, Rfl: 0   neomycin-polymyxin-hydrocortisone (CORTISPORIN) OTIC solution, Place 3 drops into the left ear 4 (four) times daily. X 7 days, Disp: 10 mL, Rfl: 0   benzonatate (TESSALON) 100 MG capsule, Take 1 capsule (100 mg total) by mouth 3 (three) times daily as needed for cough., Disp: 30 capsule, Rfl: 0   gabapentin (NEURONTIN) 300 MG capsule, Take 300 mg by mouth 3 (three) times daily., Disp: , Rfl:    ketoconazole (NIZORAL) 2 % shampoo, Apply 1 application. topically 2 (two) times a week., Disp: 120 mL, Rfl: 0   loratadine (CLARITIN) 10 MG tablet, Take 10 mg by mouth daily., Disp: , Rfl:    ondansetron (ZOFRAN-ODT) 4 MG disintegrating tablet, Take 1 tablet (4 mg total) by mouth every 8 (eight) hours as needed for nausea or vomiting., Disp: 20 tablet, Rfl: 0   sulfamethoxazole-trimethoprim (BACTRIM DS) 800-160 MG tablet, Take 1 tablet by mouth 2 (two) times daily., Disp: 14 tablet, Rfl: 0  Observations/Objective: Patient is well-developed, well-nourished in no acute distress.  Resting comfortably at home.  Head is normocephalic, atraumatic.  No labored breathing.  Speech is clear and coherent with logical content.  Patient is alert and oriented at baseline.    Assessment and Plan: 1. Non-recurrent acute suppurative otitis media of left ear without spontaneous rupture of tympanic membrane - azithromycin (ZITHROMAX) 250 MG tablet; Take 2 tablets on day 1, then 1 tablet daily on days 2 through 5  Dispense: 6 tablet; Refill: 0 - neomycin-polymyxin-hydrocortisone (CORTISPORIN) OTIC solution; Place 3 drops into the left ear 4 (four) times daily. X 7 days  Dispense: 10 mL; Refill: 0 - fluticasone (FLONASE) 50  MCG/ACT nasal spray; Place 2 sprays into both nostrils daily.  Dispense: 16 g; Refill: 0  2. Antibiotic-induced yeast infection - fluconazole (DIFLUCAN) 150 MG tablet; Take 1 tablet (150 mg total) by mouth every 3 (three) days as needed.  Dispense: 2 tablet; Refill: 0  - Worsening symptoms that have not responded to OTC medications.  - Will give Zpack and Cortisporin drops - Continue saline nasal rinses - Continue to add Flonase (Fluticasone) nasal spray over the counter for possible eustachian tube dysfunction - Steam and humidifier can help - Warm compress to ear - Diflucan given as prophylaxis as patient tends to get vaginal yeast infections with antibiotic use - Stay well hydrated and get plenty of rest.  - Seek in person evaluation if no symptom improvement or if symptoms worsen   Follow Up Instructions: I discussed the assessment and treatment plan with the patient. The patient was provided an opportunity to ask questions and all were answered. The patient agreed with the plan and demonstrated an understanding of the instructions.  A copy of instructions were sent to the patient via  MyChart unless otherwise noted below.    The patient was advised to call back or seek an in-person evaluation if the symptoms worsen or if the condition fails to improve as anticipated.  Time:  I spent 10 minutes with the patient via telehealth technology discussing the above problems/concerns.    Mar Daring, PA-C

## 2022-09-08 NOTE — Patient Instructions (Signed)
Katherine Dunlap, thank you for joining [name], PA-C for today's virtual visit.  While this provider is not your primary care provider (PCP), if your PCP is located in our provider database this encounter information will be shared with them immediately following your visit.   A [place] MyChart account gives you access to today's visit and all your visits, tests, and labs performed at [facility] " click here if you don't have a [facility] MyChart account or go to [email or web address]  Consent: (Patient) Juline Lucca provided verbal consent for this virtual visit at the beginning of the encounter.  Current Medications:  Current Outpatient Medications:    azithromycin (ZITHROMAX) 250 MG tablet, Take 2 tablets on day 1, then 1 tablet daily on days 2 through 5, Disp: 6 tablet, Rfl: 0   fluconazole (DIFLUCAN) 150 MG tablet, Take 1 tablet (150 mg total) by mouth every 3 (three) days as needed., Disp: 2 tablet, Rfl: 0   fluticasone (FLONASE) 50 MCG/ACT nasal spray, Place 2 sprays into both nostrils daily., Disp: 16 g, Rfl: 0   neomycin-polymyxin-hydrocortisone (CORTISPORIN) OTIC solution, Place 3 drops into the left ear 4 (four) times daily. X 7 days, Disp: 10 mL, Rfl: 0   benzonatate (TESSALON) 100 MG capsule, Take 1 capsule (100 mg total) by mouth 3 (three) times daily as needed for cough., Disp: 30 capsule, Rfl: 0   gabapentin (NEURONTIN) 300 MG capsule, Take 300 mg by mouth 3 (three) times daily., Disp: , Rfl:    ketoconazole (NIZORAL) 2 % shampoo, Apply 1 application. topically 2 (two) times a week., Disp: 120 mL, Rfl: 0   loratadine (CLARITIN) 10 MG tablet, Take 10 mg by mouth daily., Disp: , Rfl:    ondansetron (ZOFRAN-ODT) 4 MG disintegrating tablet, Take 1 tablet (4 mg total) by mouth every 8 (eight) hours as needed for nausea or vomiting., Disp: 20 tablet, Rfl: 0   sulfamethoxazole-trimethoprim (BACTRIM DS) 800-160 MG tablet, Take 1 tablet by mouth 2 (two) times  daily., Disp: 14 tablet, Rfl: 0   Medications ordered in this encounter:  Meds ordered this encounter  Medications   azithromycin (ZITHROMAX) 250 MG tablet    Sig: Take 2 tablets on day 1, then 1 tablet daily on days 2 through 5    Dispense:  6 tablet    Refill:  0    Order Specific Question:   Supervising Provider    Answer:   Merrilee Jansky [[id number]]   neomycin-polymyxin-hydrocortisone (CORTISPORIN) OTIC solution    Sig: Place 3 drops into the left ear 4 (four) times daily. X 7 days    Dispense:  10 mL    Refill:  0    Order Specific Question:   Supervising Provider    Answer:   Merrilee Jansky [[id number]]   fluticasone (FLONASE) 50 MCG/ACT nasal spray    Sig: Place 2 sprays into both nostrils daily.    Dispense:  16 g    Refill:  0    Order Specific Question:   Supervising Provider    Answer:   Merrilee Jansky [[id number]]   fluconazole (DIFLUCAN) 150 MG tablet    Sig: Take 1 tablet (150 mg total) by mouth every 3 (three) days as needed.    Dispense:  2 tablet    Refill:  0    Order Specific Question:   Supervising Provider    Answer:   Merrilee Jansky [id number]     *If you need refills on other  medications prior to your next appointment, please contact your pharmacy*  Follow-Up: Call back or seek an in-person evaluation if the symptoms worsen or if the condition fails to improve as anticipated.  [facility] [phone]  Other Instructions Otitis Media, Adult  Otitis media occurs when there is inflammation and fluid in the middle ear with signs and symptoms of an acute infection. The middle ear is a part of the ear that contains bones for hearing as well as air that helps send sounds to the brain. When infected fluid builds up in this space, it causes pressure and can lead to an ear infection. The eustachian tube connects the middle ear to the back of the nose (nasopharynx) and normally allows air into the middle ear. If the eustachian tube becomes  blocked, fluid can build up and become infected. What are the causes? This condition is caused by a blockage in the eustachian tube. This can be caused by mucus or by swelling of the tube. Problems that can cause a blockage include: A cold or other upper respiratory infection. Allergies. An irritant, such as tobacco smoke. Enlarged adenoids. The adenoids are areas of soft tissue located high in the back of the throat, behind the nose and the roof of the mouth. They are part of the body's defense system (immune system). A mass in the nasopharynx. Damage to the ear caused by pressure changes (barotrauma). What increases the risk? You are more likely to develop this condition if you: Smoke or are exposed to tobacco smoke. Have an opening in the roof of your mouth (cleft palate). Have gastroesophageal reflux. Have an immune system disorder. What are the signs or symptoms? Symptoms of this condition include: Ear pain. Fever. Decreased hearing. Tiredness (lethargy). Fluid leaking from the ear, if the eardrum is ruptured or has burst. Ringing in the ear. How is this diagnosed?  This condition is diagnosed with a physical exam. During the exam, your health care provider will use an instrument called an otoscope to look in your ear and check for redness, swelling, and fluid. He or she will also ask about your symptoms. Your health care provider may also order tests, such as: A pneumatic otoscopy. This is a test to check the movement of the eardrum. It is done by squeezing a small amount of air into the ear. A tympanogram. This is a test that shows how well the eardrum moves in response to air pressure in the ear canal. It provides a graph for your health care provider to review. How is this treated? This condition can go away on its own within 3-5 days. But if the condition is caused by a bacterial infection and does not go away on its own, or if it keeps coming back, your health care provider  may: Prescribe antibiotic medicine to treat the infection. Prescribe or recommend medicines to control pain. Follow these instructions at home: Take over-the-counter and prescription medicines only as told by your health care provider. If you were prescribed an antibiotic medicine, take it as told by your health care provider. Do not stop taking the antibiotic even if you start to feel better. Keep all follow-up visits. This is important. Contact a health care provider if: You have bleeding from your nose. There is a lump on your neck. You are not feeling better in 5 days. You feel worse instead of better. Get help right away if: You have severe pain that is not controlled with medicine. You have  swelling, redness, or pain around your ear. You have stiffness in your neck. A part of your face is not moving (paralyzed). The bone behind your ear (mastoid bone) is tender when you touch it. You develop a severe headache. Summary Otitis media is redness, soreness, and swelling of the middle ear, usually resulting in pain and decreased hearing. This condition can go away on its own within 3-5 days. If the problem does not go away in 3-5 days, your health care provider may give you medicines to treat the infection. If you were prescribed an antibiotic medicine, take it as told by your health care provider. Follow all instructions that were given to you by your health care provider. This information is not intended to replace advice given to you by your health care provider. Make sure you discuss any questions you have with your health care provider. Document Revised: 07/30/2020 Document Reviewed: 07/30/2020 Elsevier Patient Education  2023 Elsevier Inc.    If you have been instructed to have an in-person evaluation today at a local Urgent Care facility, please use the link below. It will take you to a list of all of our available [facility], including address, phone number and  hours of operation. Please do not delay care.  [facility]  If you or a family member do not have a primary care provider, use the link below to schedule a visit and establish care. When you choose a [facility] primary care physician or advanced practice provider, you gain a long-term partner in health. Find a Primary Care Provider  Learn more about [facility]'s in-office and virtual care options: [place] Get Care Now

## 2023-03-21 ENCOUNTER — Telehealth: Payer: No Typology Code available for payment source | Admitting: Nurse Practitioner

## 2023-03-21 DIAGNOSIS — B3731 Acute candidiasis of vulva and vagina: Secondary | ICD-10-CM

## 2023-03-21 DIAGNOSIS — N61 Mastitis without abscess: Secondary | ICD-10-CM

## 2023-03-21 MED ORDER — FLUCONAZOLE 150 MG PO TABS
150.0000 mg | ORAL_TABLET | ORAL | 0 refills | Status: DC | PRN
Start: 2023-03-21 — End: 2023-04-26

## 2023-03-21 MED ORDER — SULFAMETHOXAZOLE-TRIMETHOPRIM 800-160 MG PO TABS
1.0000 | ORAL_TABLET | Freq: Two times a day (BID) | ORAL | 0 refills | Status: AC
Start: 2023-03-21 — End: 2023-03-28

## 2023-03-21 NOTE — Patient Instructions (Signed)
[name], thank you for joining Claiborne Rigg, NP for today's virtual visit.  While this provider is not your primary care provider (PCP), if your PCP is located in our provider database this encounter information will be shared with them immediately following your visit.   A [facility] MyChart account gives you access to today's visit and all your visits, tests, and labs performed at [facility] " click here if you don't have a [place] MyChart account or go to [email or web address]  Consent: (Patient) Addysan Gotte provided verbal consent for this virtual visit at the beginning of the encounter.  Current Medications:  Current Outpatient Medications:    benzonatate (TESSALON) 100 MG capsule, Take 1 capsule (100 mg total) by mouth 3 (three) times daily as needed for cough., Disp: 30 capsule, Rfl: 0   fluconazole (DIFLUCAN) 150 MG tablet, Take 1 tablet (150 mg total) by mouth every 3 (three) days as needed., Disp: 2 tablet, Rfl: 0   fluticasone (FLONASE) 50 MCG/ACT nasal spray, Place 2 sprays into both nostrils daily., Disp: 16 g, Rfl: 0   gabapentin (NEURONTIN) 300 MG capsule, Take 300 mg by mouth 3 (three) times daily., Disp: , Rfl:    ketoconazole (NIZORAL) 2 % shampoo, Apply 1 application. topically 2 (two) times a week., Disp: 120 mL, Rfl: 0   loratadine (CLARITIN) 10 MG tablet, Take 10 mg by mouth daily., Disp: , Rfl:    neomycin-polymyxin-hydrocortisone (CORTISPORIN) OTIC solution, Place 3 drops into the left ear 4 (four) times daily. X 7 days, Disp: 10 mL, Rfl: 0   ondansetron (ZOFRAN-ODT) 4 MG disintegrating tablet, Take 1 tablet (4 mg total) by mouth every 8 (eight) hours as needed for nausea or vomiting., Disp: 20 tablet, Rfl: 0   sulfamethoxazole-trimethoprim (BACTRIM DS) 800-160 MG tablet, Take 1 tablet by mouth 2 (two) times daily for 7 days., Disp: 14 tablet, Rfl: 0   Medications ordered in this encounter:  Meds ordered this encounter  Medications    fluconazole (DIFLUCAN) 150 MG tablet    Sig: Take 1 tablet (150 mg total) by mouth every 3 (three) days as needed.    Dispense:  2 tablet    Refill:  0    Order Specific Question:   Supervising Provider    Answer:   Merrilee Jansky [[id number]]   sulfamethoxazole-trimethoprim (BACTRIM DS) 800-160 MG tablet    Sig: Take 1 tablet by mouth 2 (two) times daily for 7 days.    Dispense:  14 tablet    Refill:  0    Order Specific Question:   Supervising Provider    Answer:   Merrilee Jansky [id number]     *If you need refills on other medications prior to your next appointment, please contact your pharmacy*  Follow-Up: Call back or seek an in-person evaluation if the symptoms worsen or if the condition fails to improve as anticipated.  [facility] [phone]    If you have been instructed to have an in-person evaluation today at a local Urgent Care facility, please use the link below. It will take you to a list of all of our available [facility], including address, phone number and hours of operation. Please do not delay care.  [facility]  If you or a family member do not have a primary care provider, use the link below to schedule a visit and establish care. When you choose a [facility] primary care physician or advanced practice  provider, you gain a long-term partner in health. Find a Primary Care Provider  Learn more about [facility]'s in-office and virtual care options: [place] - Get Care Now

## 2023-03-21 NOTE — Progress Notes (Signed)
Virtual Visit Consent   Katherine Dunlap, you are scheduled for a virtual visit with a [facility] provider today. Just as with appointments in the office, your consent must be obtained to participate. Your consent will be active for this visit and any virtual visit you may have with one of our providers in the next 365 days. If you have a MyChart account, a copy of this consent can be sent to you electronically.  As this is a virtual visit, video technology does not allow for your provider to perform a traditional examination. This may limit your provider's ability to fully assess your condition. If your provider identifies any concerns that need to be evaluated in person or the need to arrange testing (such as labs, EKG, etc.), we will make arrangements to do so. Although advances in technology are sophisticated, we cannot ensure that it will always work on either your end or our end. If the connection with a video visit is poor, the visit may have to be switched to a telephone visit. With either a video or telephone visit, we are not always able to ensure that we have a secure connection.  By engaging in this virtual visit, you consent to the provision of healthcare and authorize for your insurance to be billed (if applicable) for the services provided during this visit. Depending on your insurance coverage, you may receive a charge related to this service.  I need to obtain your verbal consent now. Are you willing to proceed with your visit today? Katherine Dunlap has provided verbal consent on 03/21/2023 for a virtual visit (video or telephone). Claiborne Rigg, NP  Date: 03/21/2023 10:11 AM  Virtual Visit via Video Note   I, Claiborne Rigg, connected with  Katherine Dunlap  ([id number], Apr 21, 1996) on 03/21/23 at 10:00 AM EST by a video-enabled telemedicine application and verified that I am speaking with the correct person using two identifiers.  Location: Patient: Virtual Visit Location Patient: Home Provider:  Virtual Visit Location Provider: Home Office   I discussed the limitations of evaluation and management by telemedicine and the availability of in person appointments. The patient expressed understanding and agreed to proceed.    History of Present Illness: Katherine Dunlap is a 27 y.o. who identifies as a female who was assigned female at birth, and is being seen today for yeast vaginitis.  Ms. Breitner notes vaginal irritation over the past 3 days. Associated symptoms include: White chunky discharge, burning and itching.   She denies any risk of STD  She also has cellulitis of the left breast. On exam there is a papule on the left breast with surrounding erythema and swelling. She states it started off like a pimple and she tried to pop it and it now the area is red and pimple has increased in size.    Problems:  Patient Active Problem List   Diagnosis Date Noted   Severe depression (HCC) 05/11/2018   Migraine with aura and without status migrainosus, not intractable 07/24/2016   Environmental allergies 12/07/2015    Allergies:  Allergies  Allergen Reactions   Penicillins Hives and Itching    Did it involve swelling of the face/tongue/throat, SOB, or low BP? No Did it involve sudden or severe rash/hives, skin peeling, or any reaction on the inside of your mouth or nose? Yes Did you need to seek medical attention at a hospital or doctor's office? Yes When did it last happen?      April 2020 If all  above answers are "NO", may proceed with cephalosporin use.    Doxycycline Nausea And Vomiting   Medications:  Current Outpatient Medications:    benzonatate (TESSALON) 100 MG capsule, Take 1 capsule (100 mg total) by mouth 3 (three) times daily as needed for cough., Disp: 30 capsule, Rfl: 0   fluconazole (DIFLUCAN) 150 MG tablet, Take 1 tablet (150 mg total) by mouth every 3 (three) days as needed., Disp: 2 tablet, Rfl: 0   fluticasone (FLONASE) 50 MCG/ACT nasal spray, Place 2 sprays into both  nostrils daily., Disp: 16 g, Rfl: 0   gabapentin (NEURONTIN) 300 MG capsule, Take 300 mg by mouth 3 (three) times daily., Disp: , Rfl:    ketoconazole (NIZORAL) 2 % shampoo, Apply 1 application. topically 2 (two) times a week., Disp: 120 mL, Rfl: 0   loratadine (CLARITIN) 10 MG tablet, Take 10 mg by mouth daily., Disp: , Rfl:    neomycin-polymyxin-hydrocortisone (CORTISPORIN) OTIC solution, Place 3 drops into the left ear 4 (four) times daily. X 7 days, Disp: 10 mL, Rfl: 0   ondansetron (ZOFRAN-ODT) 4 MG disintegrating tablet, Take 1 tablet (4 mg total) by mouth every 8 (eight) hours as needed for nausea or vomiting., Disp: 20 tablet, Rfl: 0   sulfamethoxazole-trimethoprim (BACTRIM DS) 800-160 MG tablet, Take 1 tablet by mouth 2 (two) times daily for 7 days., Disp: 14 tablet, Rfl: 0  Observations/Objective: Patient is well-developed, well-nourished in no acute distress.  Resting comfortably  at home.  Head is normocephalic, atraumatic.  No labored breathing.  Speech is clear and coherent with logical content.  Patient is alert and oriented at baseline.    Assessment and Plan: 1. Yeast vaginitis - fluconazole (DIFLUCAN) 150 MG tablet; Take 1 tablet (150 mg total) by mouth every 3 (three) days as needed.  Dispense: 2 tablet; Refill: 0  2. Cellulitis of breast - sulfamethoxazole-trimethoprim (BACTRIM DS) 800-160 MG tablet; Take 1 tablet by mouth 2 (two) times daily for 7 days.  Dispense: 14 tablet; Refill: 0   Follow Up Instructions: I discussed the assessment and treatment plan with the patient. The patient was provided an opportunity to ask questions and all were answered. The patient agreed with the plan and demonstrated an understanding of the instructions.  A copy of instructions were sent to the patient via MyChart unless otherwise noted below.    The patient was advised to call back or seek an in-person evaluation if the symptoms worsen or if the condition fails to improve as  anticipated.    Claiborne Rigg, NP

## 2023-04-26 ENCOUNTER — Telehealth: Payer: No Typology Code available for payment source | Admitting: Family Medicine

## 2023-04-26 DIAGNOSIS — B3731 Acute candidiasis of vulva and vagina: Secondary | ICD-10-CM

## 2023-04-26 MED ORDER — FLUCONAZOLE 150 MG PO TABS
150.0000 mg | ORAL_TABLET | ORAL | 0 refills | Status: AC | PRN
Start: 2023-04-26 — End: ?

## 2023-04-26 NOTE — Patient Instructions (Signed)

## 2023-04-26 NOTE — Progress Notes (Signed)
Virtual Visit Consent   Katherine Dunlap, you are scheduled for a virtual visit with a [facility] provider today. Just as with appointments in the office, your consent must be obtained to participate. Your consent will be active for this visit and any virtual visit you may have with one of our providers in the next 365 days. If you have a MyChart account, a copy of this consent can be sent to you electronically.  As this is a virtual visit, video technology does not allow for your provider to perform a traditional examination. This may limit your provider's ability to fully assess your condition. If your provider identifies any concerns that need to be evaluated in person or the need to arrange testing (such as labs, EKG, etc.), we will make arrangements to do so. Although advances in technology are sophisticated, we cannot ensure that it will always work on either your end or our end. If the connection with a video visit is poor, the visit may have to be switched to a telephone visit. With either a video or telephone visit, we are not always able to ensure that we have a secure connection.  By engaging in this virtual visit, you consent to the provision of healthcare and authorize for your insurance to be billed (if applicable) for the services provided during this visit. Depending on your insurance coverage, you may receive a charge related to this service.  I need to obtain your verbal consent now. Are you willing to proceed with your visit today? Katherine Dunlap has provided verbal consent on 04/26/2023 for a virtual visit (video or telephone). Georgana Curio, FNP  Date: 04/26/2023 12:25 PM  Virtual Visit via Video Note   I, Georgana Curio, connected with  Katherine Dunlap  ([id number], 04/23/1996) on 04/26/23 at 12:30 PM EST by a video-enabled telemedicine application and verified that I am speaking with the correct person using two identifiers.  Location: Patient: Virtual Visit Location Patient: Home Provider:  Virtual Visit Location Provider: Home Office   I discussed the limitations of evaluation and management by telemedicine and the availability of in person appointments. The patient expressed understanding and agreed to proceed.    History of Present Illness: Katherine Dunlap is a 27 y.o. who identifies as a female who was assigned female at birth, and is being seen today for vaginal itching and thick white discharge for several days persistent after wearing tight clothes. No abd pain or fever.Marland Kitchen  HPI: HPI  Problems:  Patient Active Problem List   Diagnosis Date Noted   Severe depression (HCC) 05/11/2018   Migraine with aura and without status migrainosus, not intractable 07/24/2016   Environmental allergies 12/07/2015    Allergies:  Allergies  Allergen Reactions   Penicillins Hives and Itching    Did it involve swelling of the face/tongue/throat, SOB, or low BP? No Did it involve sudden or severe rash/hives, skin peeling, or any reaction on the inside of your mouth or nose? Yes Did you need to seek medical attention at a hospital or doctor's office? Yes When did it last happen?      April 2020 If all above answers are "NO", may proceed with cephalosporin use.    Doxycycline Nausea And Vomiting   Medications:  Current Outpatient Medications:    benzonatate (TESSALON) 100 MG capsule, Take 1 capsule (100 mg total) by mouth 3 (three) times daily as needed for cough., Disp: 30 capsule, Rfl: 0   fluconazole (DIFLUCAN) 150 MG tablet, Take 1 tablet (150  mg total) by mouth every 3 (three) days as needed., Disp: 2 tablet, Rfl: 0   fluticasone (FLONASE) 50 MCG/ACT nasal spray, Place 2 sprays into both nostrils daily., Disp: 16 g, Rfl: 0   gabapentin (NEURONTIN) 300 MG capsule, Take 300 mg by mouth 3 (three) times daily., Disp: , Rfl:    ketoconazole (NIZORAL) 2 % shampoo, Apply 1 application. topically 2 (two) times a week., Disp: 120 mL, Rfl: 0   loratadine (CLARITIN) 10 MG tablet, Take 10 mg by mouth  daily., Disp: , Rfl:    neomycin-polymyxin-hydrocortisone (CORTISPORIN) OTIC solution, Place 3 drops into the left ear 4 (four) times daily. X 7 days, Disp: 10 mL, Rfl: 0   ondansetron (ZOFRAN-ODT) 4 MG disintegrating tablet, Take 1 tablet (4 mg total) by mouth every 8 (eight) hours as needed for nausea or vomiting., Disp: 20 tablet, Rfl: 0  Observations/Objective: Patient is well-developed, well-nourished in no acute distress.  Resting comfortably  at home.  Head is normocephalic, atraumatic.  No labored breathing.  Speech is clear and coherent with logical content.  Patient is alert and oriented at baseline.    Assessment and Plan: 1. Candidiasis of vagina (Primary)  Follow up with pcp or UC if sx persist or worsen.   Follow Up Instructions: I discussed the assessment and treatment plan with the patient. The patient was provided an opportunity to ask questions and all were answered. The patient agreed with the plan and demonstrated an understanding of the instructions.  A copy of instructions were sent to the patient via MyChart unless otherwise noted below.     The patient was advised to call back or seek an in-person evaluation if the symptoms worsen or if the condition fails to improve as anticipated.    Georgana Curio, FNP

## 2023-10-20 ENCOUNTER — Telehealth

## 2023-12-24 ENCOUNTER — Encounter

## 2024-05-20 ENCOUNTER — Telehealth: Admitting: Physician Assistant

## 2024-05-20 DIAGNOSIS — N760 Acute vaginitis: Secondary | ICD-10-CM

## 2024-05-20 NOTE — Progress Notes (Signed)
 Because of having recurrent vaginitis without any recent testing,  I feel your condition warrants further evaluation and I recommend that you be seen in a face to face visit with your gynecologist or at one of our [facility] clinics.   NOTE: There will be NO CHARGE for this eVisit   If you are having a true medical emergency please call 911.    *Center for [employer] [facility]             [address] [place], [place] [address] [phone] (*Take patients with no insurance)  *Center for [employer] at [facility] [facility] [address] [place],  [place]  [address] [phone] (*Take patients with no insurance)  [facility] for [employer] at [employer]                                                             [address], [place], [place], [address] [phone]  Center for [facility] at [facility] [address], [place], [place], [address] [phone]  Center for [facility] at [facility] [address], [place], [place], [address] [phone]  Center for [facility] at [facility]                                 [address] [place], [place], [place], [address] [phone]  Center for [facility] at [facility]                                    [address], [place], [place], [address] [phone]  Center for [facility] at [facility] [address], [address], [place], [place], [address]                              [facility] [place] [address], [place], [place], [address] [phone]   For an urgent face to face visit, [place] has multiple urgent care centers for your convenience.  Click the link below for the full list of locations and hours, walk-in wait times, appointment scheduling options and driving directions:  Urgent Care - [place], [place], [place], [place],  [place], [place]  [place]     Your MyChart E-visit questionnaire answers were reviewed by a board certified advanced clinical practitioner to complete your personal care plan based on your specific symptoms.    Thank you for using e-Visits.
